# Patient Record
Sex: Male | Born: 1970 | Race: White | Hispanic: No | Marital: Single | Smoking: Former smoker
Health system: Southern US, Community
[De-identification: ages and names within clinical notes are randomized; demographics above are authoritative.]

## PROBLEM LIST (undated history)

## (undated) DIAGNOSIS — F419 Anxiety disorder, unspecified: Secondary | ICD-10-CM

## (undated) DIAGNOSIS — J449 Chronic obstructive pulmonary disease, unspecified: Secondary | ICD-10-CM

## (undated) DIAGNOSIS — I10 Essential (primary) hypertension: Secondary | ICD-10-CM

## (undated) HISTORY — DX: Anxiety disorder, unspecified: F41.9

## (undated) HISTORY — DX: Essential (primary) hypertension: I10

## (undated) HISTORY — DX: Chronic obstructive pulmonary disease, unspecified: J44.9

---

## 2005-03-10 ENCOUNTER — Emergency Department (HOSPITAL_COMMUNITY): Admission: EM | Admit: 2005-03-10 | Discharge: 2005-03-10 | Payer: Self-pay | Admitting: Emergency Medicine

## 2005-05-01 ENCOUNTER — Ambulatory Visit (HOSPITAL_COMMUNITY): Admission: RE | Admit: 2005-05-01 | Discharge: 2005-05-01 | Payer: Self-pay | Admitting: Surgery

## 2010-11-01 ENCOUNTER — Emergency Department (HOSPITAL_COMMUNITY): Payer: Self-pay

## 2010-11-01 ENCOUNTER — Emergency Department (HOSPITAL_COMMUNITY)
Admission: EM | Admit: 2010-11-01 | Discharge: 2010-11-01 | Disposition: A | Discharge status: Home / Self Care | Payer: Self-pay | Attending: Emergency Medicine | Admitting: Emergency Medicine

## 2010-11-01 DIAGNOSIS — S2239XA Fracture of one rib, unspecified side, initial encounter for closed fracture: Secondary | ICD-10-CM | POA: Insufficient documentation

## 2012-08-24 IMAGING — CR DG RIBS W/ CHEST 3+V*L*
4 series · 4 of 4 positions shown · non-contrast
Comparison: Chest x-ray from 05/01/2005

CLINICAL DATA: Injury with pain in the mid to upper posterior ribs.

LEFT RIBS AND CHEST - 3+ VIEW

[view not recorded (1 of 4)]
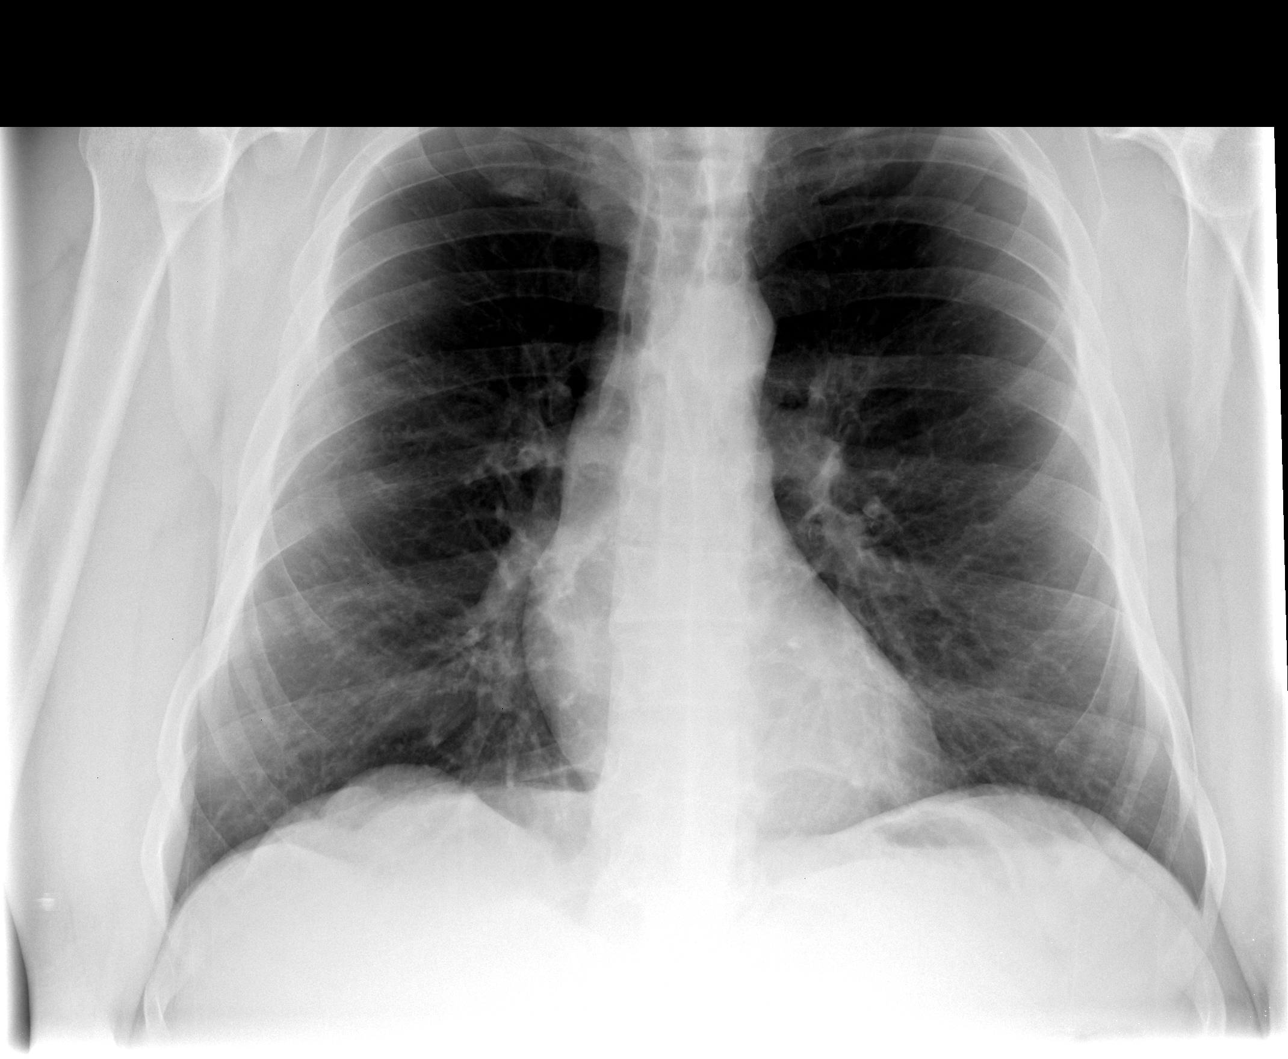

[view not recorded (2 of 4)]
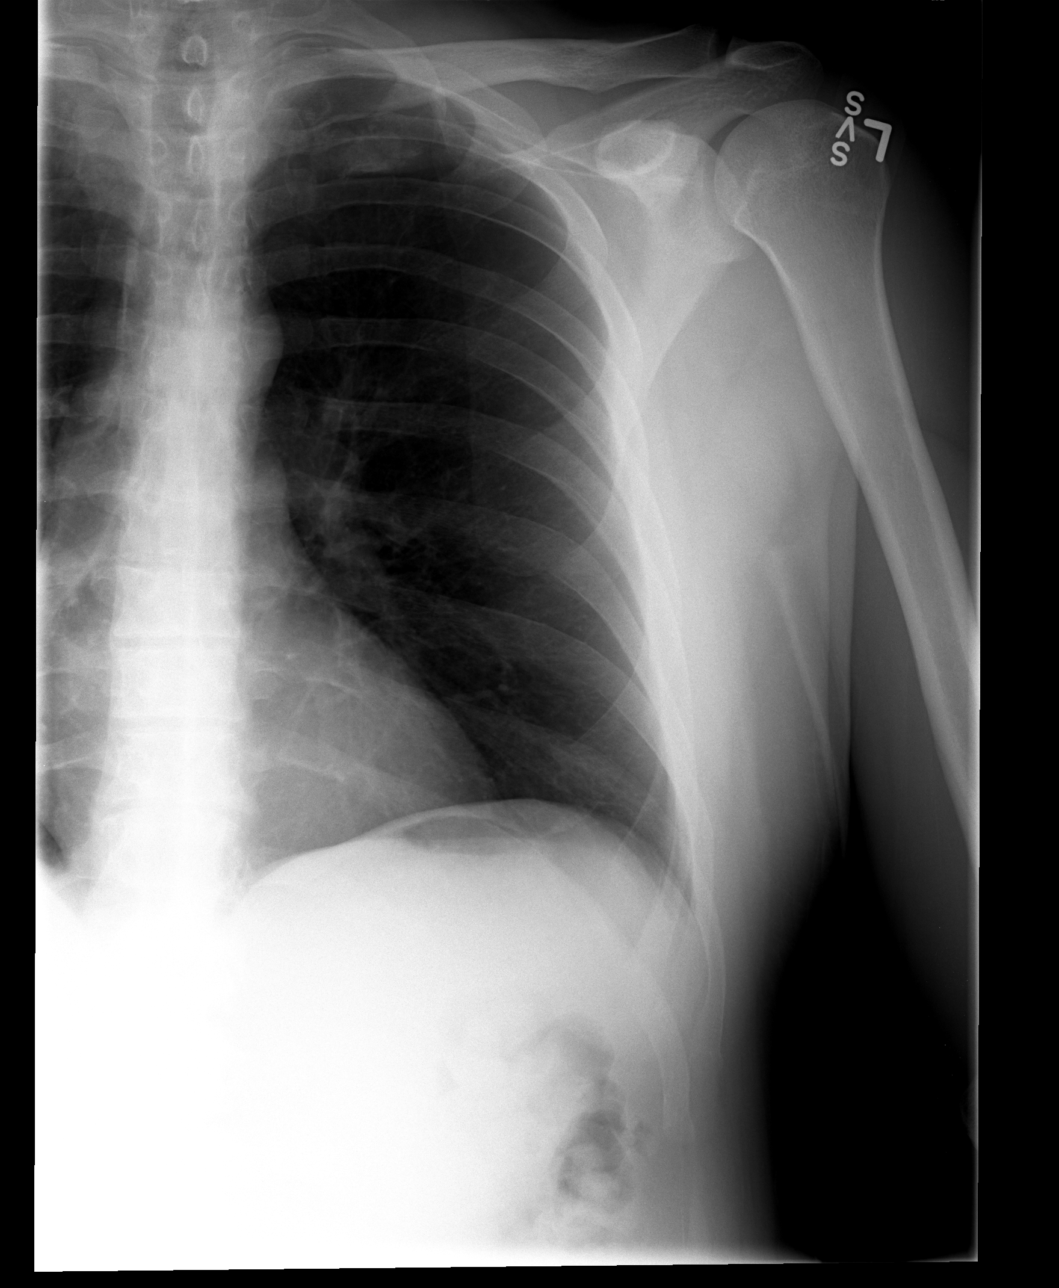

[view not recorded (3 of 4)]
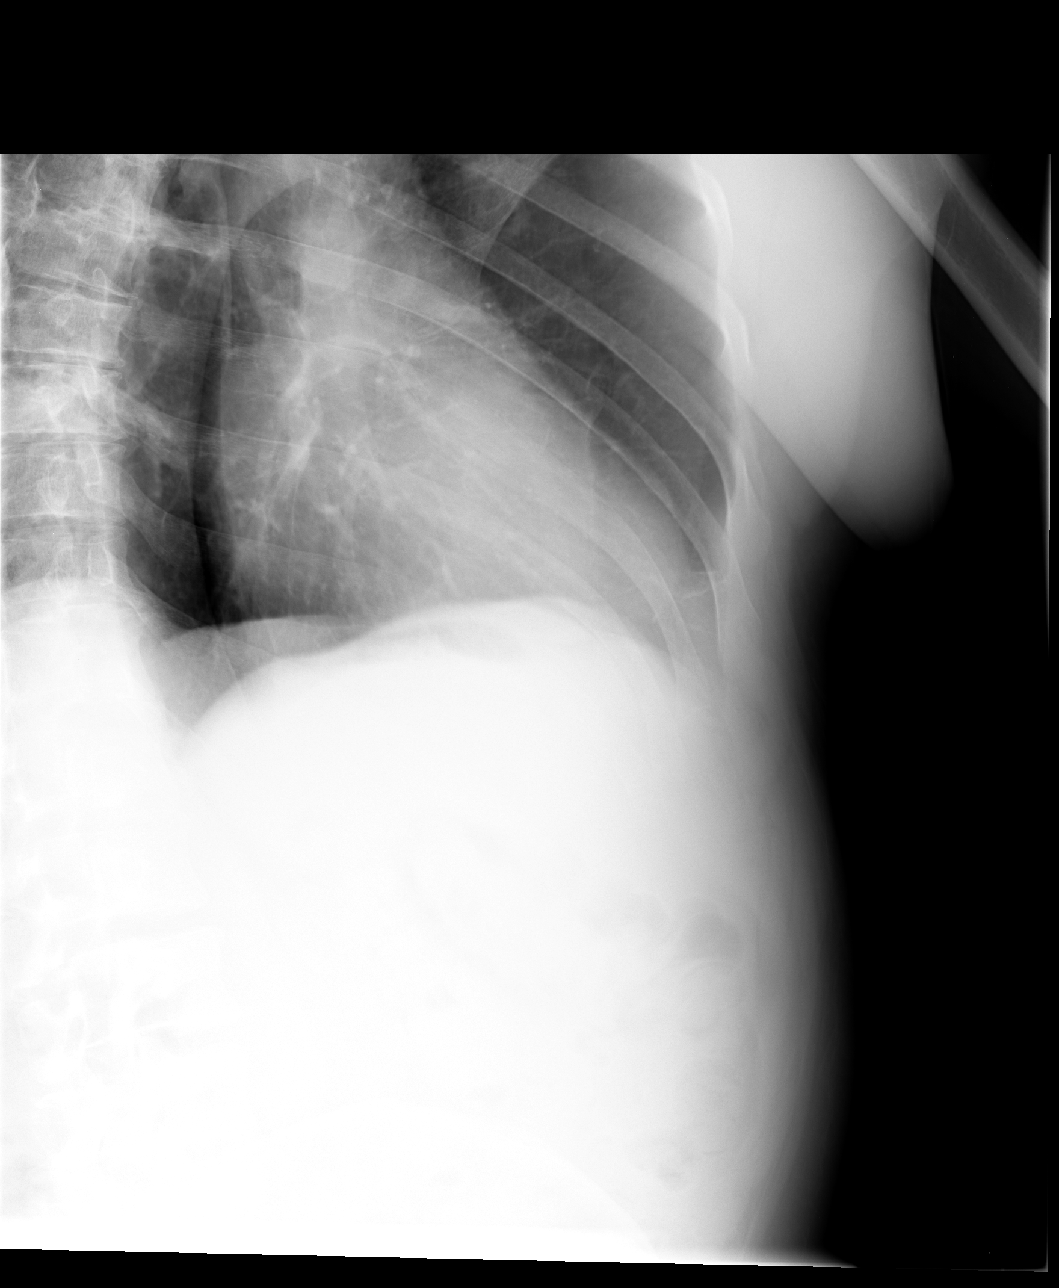

[view not recorded (4 of 4)]
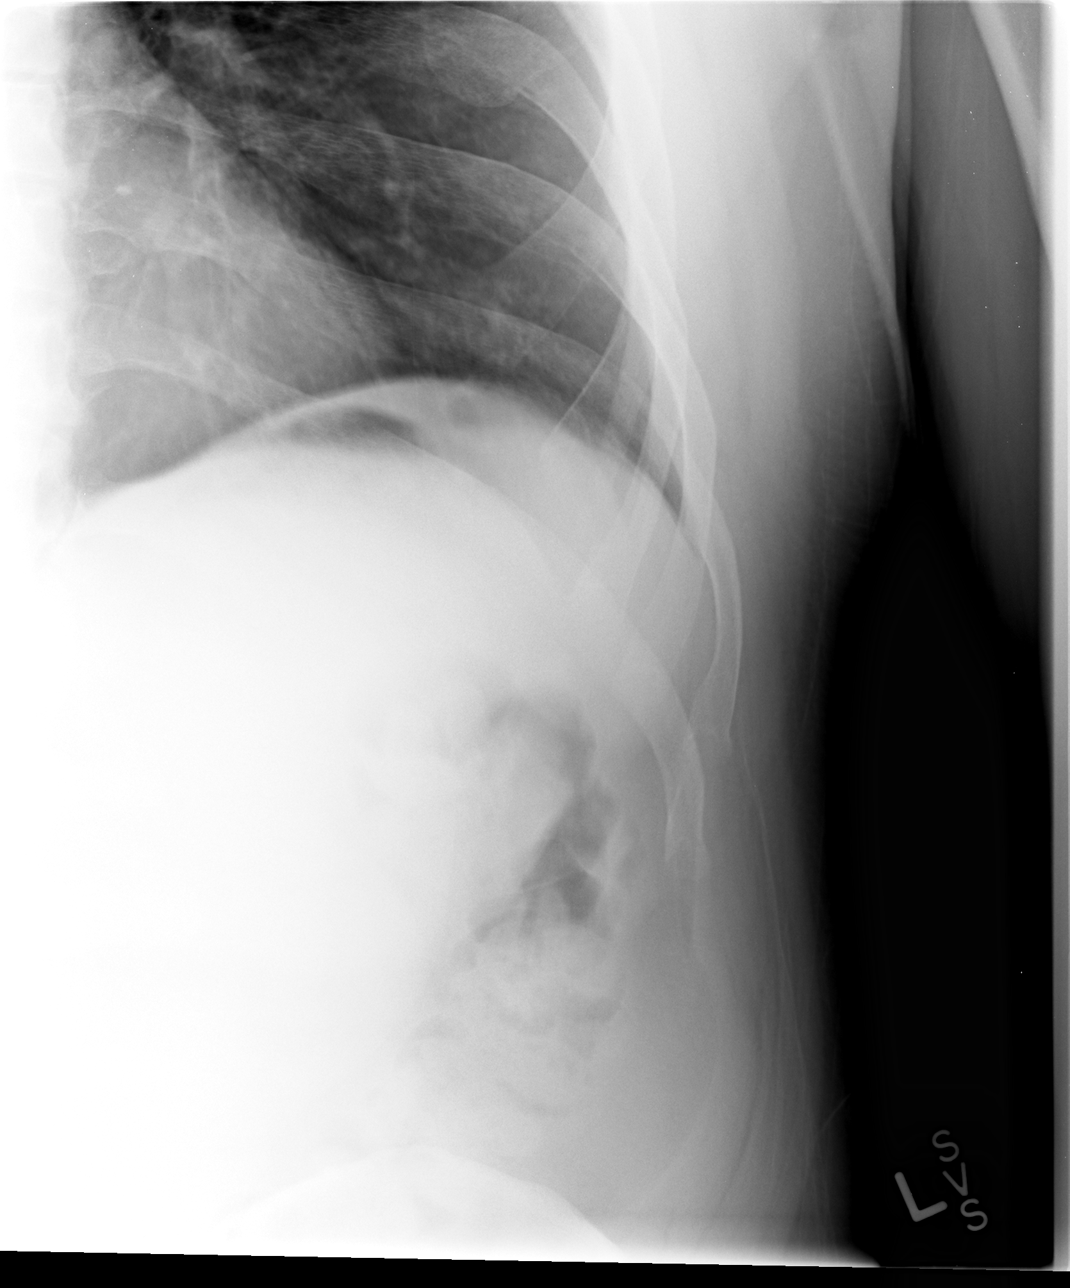

[4 of 4 positions shown; findings below may reference images not displayed]

FINDINGS: The lungs are clear without focal infiltrate, edema,
pneumothorax or pleural effusion. The cardiopericardial silhouette
is within normal limits for size.

Acute fracture of the posterior left fifth rib is evident and most
apparent on the chest film.
IMPRESSION: Posterior left fifth rib fracture.  No evidence for pneumothorax or
pleural effusion.

## 2021-04-12 DIAGNOSIS — R7989 Other specified abnormal findings of blood chemistry: Secondary | ICD-10-CM | POA: Insufficient documentation

## 2021-04-12 DIAGNOSIS — J9601 Acute respiratory failure with hypoxia: Secondary | ICD-10-CM | POA: Insufficient documentation

## 2021-11-28 DIAGNOSIS — F419 Anxiety disorder, unspecified: Secondary | ICD-10-CM | POA: Insufficient documentation

## 2021-12-29 ENCOUNTER — Encounter: Payer: Self-pay | Admitting: Nurse Practitioner

## 2021-12-29 ENCOUNTER — Ambulatory Visit (INDEPENDENT_AMBULATORY_CARE_PROVIDER_SITE_OTHER): Payer: 59 | Admitting: Nurse Practitioner

## 2021-12-29 ENCOUNTER — Telehealth: Payer: Self-pay

## 2021-12-29 VITALS — BP 161/93 | HR 76 | Temp 98.4°F | Ht 66.0 in | Wt 221.0 lb

## 2021-12-29 DIAGNOSIS — I10 Essential (primary) hypertension: Secondary | ICD-10-CM | POA: Diagnosis not present

## 2021-12-29 DIAGNOSIS — Z0001 Encounter for general adult medical examination with abnormal findings: Secondary | ICD-10-CM | POA: Diagnosis not present

## 2021-12-29 DIAGNOSIS — J449 Chronic obstructive pulmonary disease, unspecified: Secondary | ICD-10-CM | POA: Insufficient documentation

## 2021-12-29 DIAGNOSIS — I152 Hypertension secondary to endocrine disorders: Secondary | ICD-10-CM | POA: Insufficient documentation

## 2021-12-29 DIAGNOSIS — Z7689 Persons encountering health services in other specified circumstances: Secondary | ICD-10-CM

## 2021-12-29 MED ORDER — ALBUTEROL SULFATE HFA 108 (90 BASE) MCG/ACT IN AERS: INHALATION_SPRAY | RESPIRATORY_TRACT | 5 refills | Status: DC

## 2021-12-29 MED ORDER — LISINOPRIL 40 MG PO TABS: 40.0000 mg | ORAL_TABLET | Freq: Every day | ORAL | 3 refills | Status: DC

## 2021-12-29 MED ORDER — FUROSEMIDE 20 MG PO TABS: 20.0000 mg | ORAL_TABLET | Freq: Every day | ORAL | 0 refills | Status: DC

## 2021-12-29 MED ORDER — HYDRALAZINE HCL 25 MG PO TABS: 25.0000 mg | ORAL_TABLET | Freq: Three times a day (TID) | ORAL | 2 refills | Status: DC

## 2021-12-29 NOTE — Patient Instructions (Signed)
Hypertension, Adult ?Hypertension is another name for high blood pressure. High blood pressure forces your heart to work harder to pump blood. This can cause problems over time. ?There are two numbers in a blood pressure reading. There is a top number (systolic) over a bottom number (diastolic). It is best to have a blood pressure that is below 120/80. ?What are the causes? ?The cause of this condition is not known. Some other conditions can lead to high blood pressure. ?What increases the risk? ?Some lifestyle factors can make you more likely to develop high blood pressure: ?Smoking. ?Not getting enough exercise or physical activity. ?Being overweight. ?Having too much fat, sugar, calories, or salt (sodium) in your diet. ?Drinking too much alcohol. ?Other risk factors include: ?Having any of these conditions: ?Heart disease. ?Diabetes. ?High cholesterol. ?Kidney disease. ?Obstructive sleep apnea. ?Having a family history of high blood pressure and high cholesterol. ?Age. The risk increases with age. ?Stress. ?What are the signs or symptoms? ?High blood pressure may not cause symptoms. Very high blood pressure (hypertensive crisis) may cause: ?Headache. ?Fast or uneven heartbeats (palpitations). ?Shortness of breath. ?Nosebleed. ?Vomiting or feeling like you may vomit (nauseous). ?Changes in how you see. ?Very bad chest pain. ?Feeling dizzy. ?Seizures. ?How is this treated? ?This condition is treated by making healthy lifestyle changes, such as: ?Eating healthy foods. ?Exercising more. ?Drinking less alcohol. ?Your doctor may prescribe medicine if lifestyle changes do not help enough and if: ?Your top number is above 130. ?Your bottom number is above 80. ?Your personal target blood pressure may vary. ?Follow these instructions at home: ?Eating and drinking ? ?If told, follow the DASH eating plan. To follow this plan: ?Fill one half of your plate at each meal with fruits and vegetables. ?Fill one fourth of your plate  at each meal with whole grains. Whole grains include whole-wheat pasta, brown rice, and whole-grain bread. ?Eat or drink low-fat dairy products, such as skim milk or low-fat yogurt. ?Fill one fourth of your plate at each meal with low-fat (lean) proteins. Low-fat proteins include fish, chicken without skin, eggs, beans, and tofu. ?Avoid fatty meat, cured and processed meat, or chicken with skin. ?Avoid pre-made or processed food. ?Limit the amount of salt in your diet to less than 1,500 mg each day. ?Do not drink alcohol if: ?Your doctor tells you not to drink. ?You are pregnant, may be pregnant, or are planning to become pregnant. ?If you drink alcohol: ?Limit how much you have to: ?0-1 drink a day for women. ?0-2 drinks a day for men. ?Know how much alcohol is in your drink. In the [place] one drink equals one 12 oz bottle of beer (355 mL), one 5 oz glass of wine (148 mL), or one 1? oz glass of hard liquor (44 mL). ?Lifestyle ? ?Work with your doctor to stay at a healthy weight or to lose weight. Ask your doctor what the best weight is for you. ?Get at least 30 minutes of exercise that causes your heart to beat faster (aerobic exercise) most days of the week. This may include walking, swimming, or biking. ?Get at least 30 minutes of exercise that strengthens your muscles (resistance exercise) at least 3 days a week. This may include lifting weights or doing Pilates. ?Do not smoke or use any products that contain nicotine or tobacco. If you need help quitting, ask your doctor. ?Check your blood pressure at home as told by your doctor. ?Keep all follow-up visits. ?Medicines ?Take over-the-counter and prescription medicines   only as told by your doctor. Follow directions carefully. ?Do not skip doses of blood pressure medicine. The medicine does not work as well if you skip doses. Skipping doses also puts you at risk for problems. ?Ask your doctor about side effects or reactions to medicines that you should watch  for. ?Contact a doctor if: ?You think you are having a reaction to the medicine you are taking. ?You have headaches that keep coming back. ?You feel dizzy. ?You have swelling in your ankles. ?You have trouble with your vision. ?Get help right away if: ?You get a very bad headache. ?You start to feel mixed up (confused). ?You feel weak or numb. ?You feel faint. ?You have very bad pain in your: ?Chest. ?Belly (abdomen). ?You vomit more than once. ?You have trouble breathing. ?These symptoms may be an emergency. Get help right away. Call 911. ?Do not wait to see if the symptoms will go away. ?Do not drive yourself to the hospital. ?Summary ?Hypertension is another name for high blood pressure. ?High blood pressure forces your heart to work harder to pump blood. ?For most people, a normal blood pressure is less than 120/80. ?Making healthy choices can help lower blood pressure. If your blood pressure does not get lower with healthy choices, you may need to take medicine. ?This information is not intended to replace advice given to you by your health care provider. Make sure you discuss any questions you have with your health care provider. ?Document Revised: 06/05/2021 Document Reviewed: 06/05/2021 ?Elsevier Patient Education ? 2023 [employer] ? ?

## 2021-12-29 NOTE — Telephone Encounter (Signed)
Pt called back and stated he is taking his medication correctly , advised him we would be sending in a prescription. ? ?Also he does not recall the name of the hospital in [place] , pt did not sign a release today will get this at next apt  ? ?Thank you!   ?

## 2021-12-29 NOTE — Assessment & Plan Note (Addendum)
Patient is new to practice establishing care.  Hypertension is not  well controlled on current medication.  Started patient on 40 mg of lisinopril.  Daily blood pressure for 1 week follow-up in 2 weeks. ? ?Chronic disease management 3 months from today.  Provided extensive education to patient for the need to return for blood pressure management.  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 ? ?Good blood pressure cough recommended.  Patient currently uses a wrist blood pressure machine which is not so accurate.  Patient verbalized understanding and will get a new blood pressure patient ?

## 2021-12-29 NOTE — Telephone Encounter (Signed)
Thank you, He will need to keep a  blood pressure log daily for 7 days and follow up in clinic 2 weeks. He can also bring his blood pressure machine with him so we can check it. ? ? ?

## 2021-12-29 NOTE — Telephone Encounter (Signed)
Called pt about today's apt , no answer left VM for CB  ?

## 2021-12-29 NOTE — Progress Notes (Addendum)
? ? ? ? ? ?New Patient Note ? ?RE: Benjamin Casey MRN: [id number] DOB: 1971/01/09 ?Date of Office Visit: 12/29/2021 ? ?Chief Complaint: Annual Exam (CPE), Hypertension, and COPD ? ?History of Present Illness: ?Patient is a 51 year old male who presents to clinic to establish care with history of hypertension and COPD.  Assessment completed on patient.  With medication reconciliation and education completed.   ? ?.   ?Encounter for general adult medical examination  ?Physical: Patient's last physical exam was few year ago .  ?Weight: not appropriate for height (BMI greater 27%) ;  ?Blood Pressure: abnormal (BP greater than 120/80) ;  ?Medical History: Patient history reviewed ; Family history reviewed ;  ?Allergies Reviewed: No change in current allergies ;  ?Medications Reviewed: Medications reviewed - no changes ;  ?Lipids: Normal lipid levels ; labs completed results pending ?Smoking: Life-long non-smoker ; quit 11 months ago ?Physical Activity: Exercises at least 3 times per week ; No ?Alcohol/Drug Use: Is a non-drinker ; No illicit drug use ;  ?Patient is not afflicted from Stress Incontinence and Urge Incontinence  ?Safety: reviewed ; Patient wears a seat belt, has smoke detectors, has carbon monoxide detectors, wears sunscreen with extended sun exposure. ?Dental Care: biannual cleanings, brushes and flosses daily. ?Ophthalmology/Optometry: Annual visit.  ?Hearing loss: none ?Vision impairments: none  ? ?Pt presents for follow up of hypertension. Patient was diagnosed in 11 months ago.  The patient is tolerating the medication well without side effects. Compliance with treatment has been good; including taking medication as directed , maintains a healthy diet and regular exercise regimen , and following up as directed.  ? ? ?Patient presents with mild shortness of breath associated with COPD diagnosis in the last 11 months.  Patient has a 40 years smoking history and completed a low-grade CT of the lungs from a  [place] hospital.  Patient will provide/fax in hospital documentation.  There will be no repeat CT of lung ordered today until results from prior CT is reviewed. ? ? ?Assessment and Plan: ?Benjamin Casey is a 51 y.o. male with: ?Essential hypertension ?Patient is new to practice establishing care.  Hypertension is not  well controlled on current medication.  Started patient on 40 mg of lisinopril.  Daily blood pressure for 1 week follow-up in 2 weeks. ? ?Chronic disease management 3 months from today.  Provided extensive education to patient for the need to return for blood pressure management.  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 ? ?Good blood pressure cough recommended.  Patient currently uses a wrist blood pressure machine which is not so accurate.  Patient verbalized understanding and will get a new blood pressure patient ? ?Chronic obstructive pulmonary disease (HCC) ?Waiting on CT scan from a hospital in [place].  Albuterol refilled requested by patient.  Patient will need pulmonology referral in the future. ? ?Return in about 3 months (around 03/31/2022) for Chronic disease management. ? ? ?Diagnostics: ? ? ?Past Medi surgical ?Review of imaging ? ?Vitals bleeding ?Radiographic review ?Okay so ?Care.  She is here for cal History: ?Patient Active Problem List  ? Diagnosis Date Noted  ? Chronic obstructive pulmonary disease (HCC) 12/29/2021  ? Essential hypertension 12/29/2021  ? ?Past Medical History:  ?Diagnosis Date  ? Anxiety   ? COPD (chronic obstructive pulmonary disease) (HCC)   ? Hypertension   ? ?Past Surgical History: ?History reviewed. No pertinent surgical history. ?Medication List:  ?  Current Outpatient Medications  ?Medication Sig Dispense Refill  ? lisinopril (ZESTRIL) 40 MG tablet Take 1 tablet (40 mg total) by mouth daily. 90 tablet 3  ? albuterol (VENTOLIN HFA) 108 (90 Base) MCG/ACT inhaler 1 puff as needed 8 g 5  ? furosemide (LASIX) 20 MG tablet Take  1 tablet (20 mg total) by mouth daily. 90 tablet 0  ? hydrALAZINE (APRESOLINE) 25 MG tablet Take 1 tablet (25 mg total) by mouth 3 (three) times daily. 90 tablet 2  ? ?No current facility-administered medications for this visit.  ? ?Allergies: ?Allergies  ?Allergen Reactions  ? Seasonal Ic [Cholestatin]   ? ?Social History: ?Social History  ? ?Socioeconomic History  ? Marital status: Single  ?  Spouse name: Not on file  ? Number of children: Not on file  ? Years of education: Not on file  ? Highest education level: Not on file  ?Occupational History  ? Not on file  ?Tobacco Use  ? Smoking status: Former  ?  Types: Cigarettes  ?  Quit date: 01/2021  ?  Years since quitting: 0.9  ? Smokeless tobacco: Current  ?  Types: Chew  ?Vaping Use  ? Vaping Use: Never used  ?Substance and Sexual Activity  ? Alcohol use: Not Currently  ? Drug use: Not Currently  ?  Types: Marijuana  ? Sexual activity: Yes  ?Other Topics Concern  ? Not on file  ?Social History Narrative  ? Not on file  ? ?Social Determinants of Health  ? ?Financial Resource Strain: Not on file  ?Food Insecurity: Not on file  ?Transportation Needs: Not on file  ?Physical Activity: Not on file  ?Stress: Not on file  ?Social Connections: Not on file  ? ? ? ? ? ?Family History: ?Family History  ?Problem Relation Age of Onset  ? Diabetes Mother   ? Hypertension Mother   ? COPD Mother   ? COPD Father   ? Hypertension Brother   ? ?     ? ?Review of Systems  ?Constitutional: Negative.   ?HENT: Negative.    ?Eyes: Negative.   ?Respiratory:  Positive for shortness of breath. Negative for cough.   ?     History COPD  ?Cardiovascular:  Positive for leg swelling.  ?Gastrointestinal: Negative.   ?Genitourinary: Negative.   ?Musculoskeletal: Negative.   ?Skin: Negative.  Negative for rash.  ?Neurological: Negative.   ?All other systems reviewed and are negative. ? ? ?Objective: ?BP (!) 161/93 (BP Location: Left Arm, Patient Position: Sitting, Cuff Size: Large)   Pulse 76    Temp 98.4 ?F (36.9 ?C) (Temporal)   Ht 5\' 6"  (1.676 m)   Wt 221 lb (100.2 kg)   SpO2 97%   BMI 35.67 kg/m?  ?Body mass index is 35.67 kg/m?Marland Kitchen. ?Physical Exam ?Vitals and nursing note reviewed.  ?Constitutional:   ?   Appearance: Normal appearance.  ?HENT:  ?   Head: Normocephalic.  ?   Right Ear: External ear normal.  ?   Left Ear: External ear normal.  ?   Nose: Nose normal.  ?   Mouth/Throat:  ?   Mouth: Mucous membranes are moist.  ?   Pharynx: Oropharynx is clear.  ?Eyes:  ?   Conjunctiva/sclera: Conjunctivae normal.  ?Cardiovascular:  ?   Rate and Rhythm: Normal rate and regular rhythm.  ?   Pulses: Normal pulses.  ?   Heart sounds: Normal heart sounds.  ?Pulmonary:  ?   Effort: Pulmonary effort is normal.  ?  Breath sounds: Normal breath sounds.  ?Abdominal:  ?   General: Bowel sounds are normal.  ?Musculoskeletal:  ?   Left lower leg: Edema present.  ?Skin: ?   General: Skin is warm.  ?   Findings: No rash.  ?Neurological:  ?   General: No focal deficit present.  ?   Mental Status: He is alert and oriented to person, place, and time.  ?Psychiatric:     ?   Behavior: Behavior normal.  ? ?The plan was reviewed with the patient/family, and all questions/concerned were addressed. ? ?It was my pleasure to see Yonael today and participate in his care. Please feel free to contact me with any questions or concerns. ? ?Sincerely, ? ?Lynnell Chad NP ?[facility] ?

## 2021-12-29 NOTE — Assessment & Plan Note (Signed)
Waiting on CT scan from a hospital in [place].  Albuterol refilled requested by patient.  Patient will need pulmonology referral in the future. ?

## 2021-12-29 NOTE — Telephone Encounter (Signed)
LM to call and make 2 week appt - aware to call office to set up  ?

## 2021-12-30 LAB — CBC WITH DIFFERENTIAL/PLATELET
Basophils Absolute: 0.1 10*3/uL (ref 0.0–0.2)
Basos: 1 %
EOS (ABSOLUTE): 0.3 10*3/uL (ref 0.0–0.4)
Eos: 4 %
Hematocrit: 46.2 % (ref 37.5–51.0)
Hemoglobin: 15.8 g/dL (ref 13.0–17.7)
Immature Grans (Abs): 0.1 10*3/uL (ref 0.0–0.1)
Immature Granulocytes: 1 %
Lymphocytes Absolute: 2.1 10*3/uL (ref 0.7–3.1)
Lymphs: 30 %
MCH: 30.2 pg (ref 26.6–33.0)
MCHC: 34.2 g/dL (ref 31.5–35.7)
MCV: 88 fL (ref 79–97)
Monocytes Absolute: 0.7 10*3/uL (ref 0.1–0.9)
Monocytes: 10 %
Neutrophils Absolute: 3.8 10*3/uL (ref 1.4–7.0)
Neutrophils: 54 %
Platelets: 227 10*3/uL (ref 150–450)
RBC: 5.23 x10E6/uL (ref 4.14–5.80)
RDW: 13.5 % (ref 11.6–15.4)
WBC: 7.1 10*3/uL (ref 3.4–10.8)

## 2021-12-30 LAB — COMPREHENSIVE METABOLIC PANEL
ALT: 58 IU/L — ABNORMAL HIGH (ref 0–44)
AST: 35 IU/L (ref 0–40)
Albumin/Globulin Ratio: 2 (ref 1.2–2.2)
Albumin: 4.2 g/dL (ref 3.8–4.9)
Alkaline Phosphatase: 91 IU/L (ref 44–121)
BUN/Creatinine Ratio: 6 — ABNORMAL LOW (ref 9–20)
BUN: 7 mg/dL (ref 6–24)
Bilirubin Total: 0.4 mg/dL (ref 0.0–1.2)
CO2: 29 mmol/L (ref 20–29)
Calcium: 9.2 mg/dL (ref 8.7–10.2)
Chloride: 100 mmol/L (ref 96–106)
Creatinine, Ser: 1.2 mg/dL (ref 0.76–1.27)
Globulin, Total: 2.1 g/dL (ref 1.5–4.5)
Glucose: 93 mg/dL (ref 70–99)
Potassium: 4.5 mmol/L (ref 3.5–5.2)
Sodium: 143 mmol/L (ref 134–144)
Total Protein: 6.3 g/dL (ref 6.0–8.5)
eGFR: 73 mL/min/{1.73_m2} (ref >59)

## 2021-12-30 LAB — LIPID PANEL
Chol/HDL Ratio: 2.8 ratio (ref 0.0–5.0)
Cholesterol, Total: 140 mg/dL (ref 100–199)
HDL: 50 mg/dL (ref >39)
LDL Chol Calc (NIH): 73 mg/dL (ref 0–99)
Triglycerides: 92 mg/dL (ref 0–149)
VLDL Cholesterol Cal: 17 mg/dL (ref 5–40)

## 2022-03-05 ENCOUNTER — Encounter: Payer: Self-pay | Admitting: *Deleted

## 2022-04-01 ENCOUNTER — Encounter: Payer: Self-pay | Admitting: Nurse Practitioner

## 2022-04-01 ENCOUNTER — Ambulatory Visit: Payer: 59 | Admitting: Nurse Practitioner

## 2022-04-01 DIAGNOSIS — I10 Essential (primary) hypertension: Secondary | ICD-10-CM | POA: Diagnosis not present

## 2022-04-01 DIAGNOSIS — J449 Chronic obstructive pulmonary disease, unspecified: Secondary | ICD-10-CM | POA: Diagnosis not present

## 2022-04-01 MED ORDER — ALBUTEROL SULFATE HFA 108 (90 BASE) MCG/ACT IN AERS: INHALATION_SPRAY | RESPIRATORY_TRACT | 5 refills | Status: DC

## 2022-04-01 MED ORDER — FUROSEMIDE 20 MG PO TABS: 20.0000 mg | ORAL_TABLET | Freq: Every day | ORAL | 1 refills | Status: DC

## 2022-04-01 MED ORDER — HYDRALAZINE HCL 25 MG PO TABS: 25.0000 mg | ORAL_TABLET | Freq: Three times a day (TID) | ORAL | 2 refills | Status: DC

## 2022-04-01 MED ORDER — LISINOPRIL 40 MG PO TABS: 40.0000 mg | ORAL_TABLET | Freq: Every day | ORAL | 1 refills | Status: DC

## 2022-04-01 NOTE — Assessment & Plan Note (Signed)
No changes to current medication, hypertension is well controlled. Follow up in 6 months, medication refill sent to pharmacy

## 2022-04-01 NOTE — Patient Instructions (Signed)
Hypertension, Adult ?Hypertension is another name for high blood pressure. High blood pressure forces your heart to work harder to pump blood. This can cause problems over time. ?There are two numbers in a blood pressure reading. There is a top number (systolic) over a bottom number (diastolic). It is best to have a blood pressure that is below 120/80. ?What are the causes? ?The cause of this condition is not known. Some other conditions can lead to high blood pressure. ?What increases the risk? ?Some lifestyle factors can make you more likely to develop high blood pressure: ?Smoking. ?Not getting enough exercise or physical activity. ?Being overweight. ?Having too much fat, sugar, calories, or salt (sodium) in your diet. ?Drinking too much alcohol. ?Other risk factors include: ?Having any of these conditions: ?Heart disease. ?Diabetes. ?High cholesterol. ?Kidney disease. ?Obstructive sleep apnea. ?Having a family history of high blood pressure and high cholesterol. ?Age. The risk increases with age. ?Stress. ?What are the signs or symptoms? ?High blood pressure may not cause symptoms. Very high blood pressure (hypertensive crisis) may cause: ?Headache. ?Fast or uneven heartbeats (palpitations). ?Shortness of breath. ?Nosebleed. ?Vomiting or feeling like you may vomit (nauseous). ?Changes in how you see. ?Very bad chest pain. ?Feeling dizzy. ?Seizures. ?How is this treated? ?This condition is treated by making healthy lifestyle changes, such as: ?Eating healthy foods. ?Exercising more. ?Drinking less alcohol. ?Your doctor may prescribe medicine if lifestyle changes do not help enough and if: ?Your top number is above 130. ?Your bottom number is above 80. ?Your personal target blood pressure may vary. ?Follow these instructions at home: ?Eating and drinking ? ?If told, follow the DASH eating plan. To follow this plan: ?Fill one half of your plate at each meal with fruits and vegetables. ?Fill one fourth of your plate  at each meal with whole grains. Whole grains include whole-wheat pasta, brown rice, and whole-grain bread. ?Eat or drink low-fat dairy products, such as skim milk or low-fat yogurt. ?Fill one fourth of your plate at each meal with low-fat (lean) proteins. Low-fat proteins include fish, chicken without skin, eggs, beans, and tofu. ?Avoid fatty meat, cured and processed meat, or chicken with skin. ?Avoid pre-made or processed food. ?Limit the amount of salt in your diet to less than 1,500 mg each day. ?Do not drink alcohol if: ?Your doctor tells you not to drink. ?You are pregnant, may be pregnant, or are planning to become pregnant. ?If you drink alcohol: ?Limit how much you have to: ?0-1 drink a day for women. ?0-2 drinks a day for men. ?Know how much alcohol is in your drink. In the [place] one drink equals one 12 oz bottle of beer (355 mL), one 5 oz glass of wine (148 mL), or one 1? oz glass of hard liquor (44 mL). ?Lifestyle ? ?Work with your doctor to stay at a healthy weight or to lose weight. Ask your doctor what the best weight is for you. ?Get at least 30 minutes of exercise that causes your heart to beat faster (aerobic exercise) most days of the week. This may include walking, swimming, or biking. ?Get at least 30 minutes of exercise that strengthens your muscles (resistance exercise) at least 3 days a week. This may include lifting weights or doing Pilates. ?Do not smoke or use any products that contain nicotine or tobacco. If you need help quitting, ask your doctor. ?Check your blood pressure at home as told by your doctor. ?Keep all follow-up visits. ?Medicines ?Take over-the-counter and prescription medicines   only as told by your doctor. Follow directions carefully. ?Do not skip doses of blood pressure medicine. The medicine does not work as well if you skip doses. Skipping doses also puts you at risk for problems. ?Ask your doctor about side effects or reactions to medicines that you should watch  for. ?Contact a doctor if: ?You think you are having a reaction to the medicine you are taking. ?You have headaches that keep coming back. ?You feel dizzy. ?You have swelling in your ankles. ?You have trouble with your vision. ?Get help right away if: ?You get a very bad headache. ?You start to feel mixed up (confused). ?You feel weak or numb. ?You feel faint. ?You have very bad pain in your: ?Chest. ?Belly (abdomen). ?You vomit more than once. ?You have trouble breathing. ?These symptoms may be an emergency. Get help right away. Call 911. ?Do not wait to see if the symptoms will go away. ?Do not drive yourself to the hospital. ?Summary ?Hypertension is another name for high blood pressure. ?High blood pressure forces your heart to work harder to pump blood. ?For most people, a normal blood pressure is less than 120/80. ?Making healthy choices can help lower blood pressure. If your blood pressure does not get lower with healthy choices, you may need to take medicine. ?This information is not intended to replace advice given to you by your health care provider. Make sure you discuss any questions you have with your health care provider. ?Document Revised: 06/05/2021 Document Reviewed: 06/05/2021 ?Elsevier Patient Education ? 2023 [employer] ? ?

## 2022-04-01 NOTE — Progress Notes (Signed)
   Virtual Visit  Note Due to COVID-19 pandemic this visit was conducted virtually. This visit type was conducted due to national recommendations for restrictions regarding the COVID-19 Pandemic (e.g. social distancing, sheltering in place) in an effort to limit this patient's exposure and mitigate transmission in our community. All issues noted in this document were discussed and addressed.  A physical exam was not performed with this format.  I connected with Benjamin Casey on 04/01/22 at 2:40 pm  by telephone and verified that I am speaking with the correct person using two identifiers. Benjamin Casey is currently located at home during visit. The provider, Daryll Drown, NP is located in their office at time of visit.  I discussed the limitations, risks, security and privacy concerns of performing an evaluation and management service by telephone and the availability of in person appointments. I also discussed with the patient that there may be a patient responsible charge related to this service. The patient expressed understanding and agreed to proceed.   History and Present Illness:  HPI  Hypertension: Patient here for follow-up of elevated blood pressure. He is exercising and is adherent to low salt diet.  Blood pressure is well controlled at home. Cardiac symptoms none. Patient denies none.  Cardiovascular risk factors: hypertension and male gender. Use of agents associated with hypertension: none. History of target organ damage: none.   ROS   Observations/Objective: Tele-visit patient is not in distress  Assessment and Plan:  Essential hypertension No changes to current medication, hypertension is well controlled. Follow up in 6 months, medication refill sent to pharmacy   Follow Up Instructions: Follow up in 6 months.     I discussed the assessment and treatment plan with the patient. The patient was provided an opportunity to ask questions and all were answered. The patient  agreed with the plan and demonstrated an understanding of the instructions.   The patient was advised to call back or seek an in-person evaluation if the symptoms worsen or if the condition fails to improve as anticipated.  The above assessment and management plan was discussed with the patient. The patient verbalized understanding of and has agreed to the management plan. Patient is aware to call the clinic if symptoms persist or worsen. Patient is aware when to return to the clinic for a follow-up visit. Patient educated on when it is appropriate to go to the emergency department.   Time call ended:  2:55 pm   I provided 15 minutes of  non face-to-face time during this encounter.    Daryll Drown, NP

## 2022-04-03 ENCOUNTER — Ambulatory Visit: Payer: 59 | Admitting: Nurse Practitioner

## 2022-10-26 ENCOUNTER — Telehealth: Payer: Self-pay | Admitting: Nurse Practitioner

## 2022-10-26 NOTE — Telephone Encounter (Signed)
  Prescription Request  10/26/2022  Is this a "Controlled Substance" medicine? NO   Have you seen your PCP in the last 2 weeks? NO was a [facility] pt, has appt with Alvie Heidelberg on 12/03/2022 this is the soonest he can come in and will need refills on medications  If YES, route message to pool  -  If NO, patient needs to be scheduled for appointment.  What is the name of the medication or equipment? albuterol (VENTOLIN HFA) 108 (90 Base) MCG/ACT inhaler  furosemide (LASIX) 20 MG tablet  hydrALAZINE (APRESOLINE) 25 MG tablet  lisinopril (ZESTRIL) 40 MG tablet   Have you contacted your pharmacy to request a refill? no   Which pharmacy would you like this sent to? [facility]   Patient notified that their request is being sent to the clinical staff for review and that they should receive a response within 2 business days.

## 2022-10-27 ENCOUNTER — Other Ambulatory Visit: Payer: Self-pay

## 2022-10-27 DIAGNOSIS — J449 Chronic obstructive pulmonary disease, unspecified: Secondary | ICD-10-CM

## 2022-10-27 DIAGNOSIS — I10 Essential (primary) hypertension: Secondary | ICD-10-CM

## 2022-10-27 MED ORDER — LISINOPRIL 40 MG PO TABS: 40.0000 mg | ORAL_TABLET | Freq: Every day | ORAL | 2 refills | Status: DC

## 2022-10-27 MED ORDER — ALBUTEROL SULFATE HFA 108 (90 BASE) MCG/ACT IN AERS: INHALATION_SPRAY | RESPIRATORY_TRACT | 5 refills | Status: DC

## 2022-10-27 MED ORDER — FUROSEMIDE 20 MG PO TABS: 20.0000 mg | ORAL_TABLET | Freq: Every day | ORAL | 2 refills | Status: DC

## 2022-10-27 MED ORDER — HYDRALAZINE HCL 25 MG PO TABS: 25.0000 mg | ORAL_TABLET | Freq: Three times a day (TID) | ORAL | 2 refills | Status: DC

## 2022-12-03 ENCOUNTER — Encounter: Payer: Self-pay | Admitting: Family Medicine

## 2022-12-03 ENCOUNTER — Ambulatory Visit: Payer: BC Managed Care – PPO | Admitting: Family Medicine

## 2022-12-03 VITALS — BP 118/72 | HR 87 | Temp 98.3°F | Ht 66.0 in | Wt 220.0 lb

## 2022-12-03 DIAGNOSIS — I10 Essential (primary) hypertension: Secondary | ICD-10-CM

## 2022-12-03 DIAGNOSIS — R600 Localized edema: Secondary | ICD-10-CM

## 2022-12-03 DIAGNOSIS — Z720 Tobacco use: Secondary | ICD-10-CM

## 2022-12-03 DIAGNOSIS — T733XXA Exhaustion due to excessive exertion, initial encounter: Secondary | ICD-10-CM

## 2022-12-03 DIAGNOSIS — J449 Chronic obstructive pulmonary disease, unspecified: Secondary | ICD-10-CM

## 2022-12-03 DIAGNOSIS — Z1331 Encounter for screening for depression: Secondary | ICD-10-CM

## 2022-12-03 DIAGNOSIS — Z136 Encounter for screening for cardiovascular disorders: Secondary | ICD-10-CM

## 2022-12-03 NOTE — Addendum Note (Signed)
Addended by: Donzetta Kohut on: 12/03/2022 08:57 PM   Modules accepted: Level of Service

## 2022-12-03 NOTE — Progress Notes (Signed)
Acute Office Visit  Subjective:  Patient ID: Benjamin Casey, male    DOB: [birth year], 52 y.o.   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 States that he feels his stamina has dropped since turning 49.  Endorses that part of it is mental and part is metabolism  States that a friend of his is taking testosterone and is helping him and is interested in it.  Endorses poor sleep quality off and on. States that he sleeps basically every other night   Hypertension This is a chronic problem. The current episode started more than 1 year ago. The problem has been rapidly improving since onset. The problem is controlled. Associated symptoms include peripheral edema. Pertinent negatives include no blurred vision, chest pain, headaches, malaise/fatigue, neck pain, orthopnea, palpitations, PND, shortness of breath or sweats. There are no associated agents to hypertension. Risk factors for coronary artery disease include dyslipidemia, male gender, obesity, stress, smoking/tobacco exposure and sedentary lifestyle. Past treatments include ACE inhibitors and diuretics. The current treatment provides significant improvement. Compliance problems include medication cost, exercise, diet and psychosocial issues.    COPD  States that he struggles with breathing when he is traveling cross[place]country to areas with "thinner" air  Typically has to use his inhaler after he has exerted himself loading trailer onto truck.   Tobacco Use  Smoked tobacco and marijuana for years. Quit after he was hospitalized  Continues to use zyn. Uses marijuana very rarely, states that he is randomly drug tested at work.  Has gained 23lbs since quitting smoking.  Endorses heartburn. States that he has changed his diet to  reduce his symptoms.   Depression  Endorses low motivation and fatigue    Edema States that he has had lower left leg edema for >1 year  No pain, pallor, paresthesia Endorses varicose veins   Review of Systems  Constitutional:  Negative for malaise/fatigue.  Eyes:  Negative for blurred vision.  Respiratory:  Negative for shortness of breath.   Cardiovascular:  Negative for chest pain, palpitations, orthopnea and PND.  Musculoskeletal:  Negative for neck pain.  Neurological:  Negative for headaches.  As Per HPI   Objective:  BP 118/72   Pulse 87   Temp 98.3 F (36.8 C)   Ht 5\' 6"  (1.676 m)   Wt 220 lb (99.8 kg)   SpO2 94%   BMI 35.51 kg/m   Physical Exam Constitutional:      General: He is not in acute distress.    Appearance: Normal appearance. He is not ill-appearing, toxic-appearing or diaphoretic.  Cardiovascular:     Rate and Rhythm: Normal rate and regular rhythm.     Pulses: Normal pulses.          Posterior tibial pulses are 2+ on the right side and 2+ on the left side.     Heart sounds: Normal heart sounds. No murmur heard.    No gallop.     Comments: Varicose veins present along lower left extremity  Pulmonary:     Effort: Pulmonary effort is normal. No respiratory distress.     Breath sounds: Decreased air movement present. Decreased breath sounds present. No wheezing, rhonchi or rales.  Musculoskeletal:     Left lower  leg: Swelling present. No deformity, lacerations, tenderness or bony tenderness. 2+ Edema present.  Skin:    General: Skin is warm.     Capillary Refill: Capillary refill takes less than 2 seconds.  Neurological:     General: No focal deficit present.     Mental Status: He is alert and oriented to person, place, and time. Mental status is at baseline.     Motor: No weakness.  Psychiatric:        Mood and Affect: Mood normal.        Behavior: Behavior normal.        Thought Content: Thought content normal.        Judgment: Judgment  normal.    Assessment & Plan:  Benjamin Casey was seen today for establish care. Diagnoses and all orders for this visit:  Primary hypertension -     CMP14+EGFR; Future Well controlled. Continue current regimen. Labs as above.   Edema of left lower leg -     Ambulatory referral to Vascular Surgery; Future Referral placed as above for edema. Education provided to patient for red flag symptoms for patient to present to ED for potential DVT.   Chronic obstructive pulmonary disease, unspecified COPD type Provided patient with sample of Breztri  Patient to take one inhalation twice daily Patient is not established with Pulm  Confirmed "severe emphysema with chronic appearing interstitial changes" on CT from OSH 04/12/2021. Of note, CT also identified shotty lymph nodes in the mediastinum in the right lung hilum   Fatigue due to excessive exertion, initial encounter Labs as below. Will communicate results to patient once available.  Discussed with patient that testosterone is a controlled substance that if he would like to begin work up for testosterone supplementation will need to complete CSA and toxassure. Will assess other causes of fatigue: anemia, thyroid dysfunction. Patient declined referral for sleep study at this time.  -     Anemia Profile B; Future -     Thyroid Panel With TSH; Future -     Testosterone,Free and Total; Future -     VITAMIN D 25 Hydroxy (Vit-D Deficiency, Fractures); Future -     ToxASSURE Select 13 (MW), Urine  Encounter for screening for depression Pt screened positive for depression today with PHQ-9. Pt offered nonpharmacologic and pharmacologic therapy. Pt declined initiating treatment at this time. Safety contract established today with patient in clinic. Denies intent to harm herself or others. Pt to notify provider if they would like to initiate treatment.   Encounter for screening for cardiovascular disorders -     Lipid panel; Future Not fasting. Instructed  patient to return to clinic when he is fasting.   Tobacco use Continues to use tobacco. Not ready to quit at this time. Will assess readiness to quit   The above assessment and management plan was discussed with the patient. The patient verbalized understanding of and has agreed to the management plan using shared-decision making. Patient is aware to call the clinic if they develop any new symptoms or if symptoms fail to improve or worsen. Patient is aware when to return to the clinic for a follow-up visit. Patient educated on when it is appropriate to go to the emergency department.   Follow up in 1 month for CPE and chronic conditions   Donzetta Kohut, DNP-FNP [facility] [address] [place], [place] [address] [phone]

## 2022-12-25 ENCOUNTER — Other Ambulatory Visit: Payer: Self-pay | Admitting: *Deleted

## 2022-12-25 DIAGNOSIS — M7989 Other specified soft tissue disorders: Secondary | ICD-10-CM

## 2023-01-01 ENCOUNTER — Encounter: Payer: Self-pay | Admitting: Vascular Surgery

## 2023-01-01 ENCOUNTER — Encounter: Payer: Self-pay | Admitting: Family Medicine

## 2023-01-01 ENCOUNTER — Encounter: Payer: BC Managed Care – PPO | Admitting: Family Medicine

## 2023-01-01 ENCOUNTER — Ambulatory Visit (HOSPITAL_COMMUNITY): Admission: RE | Admit: 2023-01-01 | Payer: Self-pay | Source: Ambulatory Visit

## 2023-06-02 ENCOUNTER — Other Ambulatory Visit: Payer: Self-pay | Admitting: Family Medicine

## 2023-06-02 DIAGNOSIS — J449 Chronic obstructive pulmonary disease, unspecified: Secondary | ICD-10-CM

## 2023-07-04 ENCOUNTER — Other Ambulatory Visit: Payer: Self-pay | Admitting: Family Medicine

## 2023-07-04 DIAGNOSIS — I10 Essential (primary) hypertension: Secondary | ICD-10-CM

## 2023-07-04 DIAGNOSIS — J449 Chronic obstructive pulmonary disease, unspecified: Secondary | ICD-10-CM

## 2023-07-05 MED ORDER — LISINOPRIL 40 MG PO TABS
40.0000 mg | ORAL_TABLET | Freq: Every day | ORAL | 1 refills | Status: DC
Start: 2023-07-05 — End: 2023-08-13

## 2023-07-05 MED ORDER — HYDRALAZINE HCL 25 MG PO TABS
25.0000 mg | ORAL_TABLET | Freq: Three times a day (TID) | ORAL | 1 refills | Status: DC
Start: 2023-07-05 — End: 2023-08-13

## 2023-07-05 MED ORDER — FUROSEMIDE 20 MG PO TABS
20.0000 mg | ORAL_TABLET | Freq: Every day | ORAL | 1 refills | Status: DC
Start: 2023-07-05 — End: 2023-08-13

## 2023-07-05 NOTE — Addendum Note (Signed)
Addended by: Julious Payer D on: 07/05/2023 03:31 PM   Modules accepted: Orders

## 2023-07-05 NOTE — Telephone Encounter (Signed)
Made appt for Dec 13. 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 He said he was also out of all of his meds. He needs lasix, apresoline and zestril

## 2023-07-05 NOTE — Telephone Encounter (Signed)
Gabrielle pt NTBS 30-d given 06/03/23

## 2023-08-06 ENCOUNTER — Other Ambulatory Visit: Payer: Self-pay | Admitting: Family Medicine

## 2023-08-06 DIAGNOSIS — J449 Chronic obstructive pulmonary disease, unspecified: Secondary | ICD-10-CM

## 2023-08-13 ENCOUNTER — Encounter: Payer: Self-pay | Admitting: Family Medicine

## 2023-08-13 ENCOUNTER — Ambulatory Visit: Payer: BC Managed Care – PPO | Admitting: Family Medicine

## 2023-08-13 VITALS — BP 117/76 | HR 87 | Temp 98.0°F | Ht 66.0 in | Wt 235.0 lb

## 2023-08-13 DIAGNOSIS — Z136 Encounter for screening for cardiovascular disorders: Secondary | ICD-10-CM | POA: Diagnosis not present

## 2023-08-13 DIAGNOSIS — J441 Chronic obstructive pulmonary disease with (acute) exacerbation: Secondary | ICD-10-CM | POA: Diagnosis not present

## 2023-08-13 DIAGNOSIS — R5383 Other fatigue: Secondary | ICD-10-CM | POA: Diagnosis not present

## 2023-08-13 DIAGNOSIS — I10 Essential (primary) hypertension: Secondary | ICD-10-CM

## 2023-08-13 DIAGNOSIS — T733XXA Exhaustion due to excessive exertion, initial encounter: Secondary | ICD-10-CM | POA: Diagnosis not present

## 2023-08-13 DIAGNOSIS — T733XXD Exhaustion due to excessive exertion, subsequent encounter: Secondary | ICD-10-CM

## 2023-08-13 DIAGNOSIS — R600 Localized edema: Secondary | ICD-10-CM | POA: Diagnosis not present

## 2023-08-13 DIAGNOSIS — J449 Chronic obstructive pulmonary disease, unspecified: Secondary | ICD-10-CM

## 2023-08-13 LAB — LIPID PANEL

## 2023-08-13 MED ORDER — PREDNISONE 20 MG PO TABS
40.0000 mg | ORAL_TABLET | Freq: Every day | ORAL | 0 refills | Status: AC
Start: 2023-08-13 — End: 2023-08-18

## 2023-08-13 MED ORDER — LISINOPRIL 40 MG PO TABS
40.0000 mg | ORAL_TABLET | Freq: Every day | ORAL | 1 refills | Status: DC
Start: 2023-08-13 — End: 2023-10-21

## 2023-08-13 MED ORDER — ALBUTEROL SULFATE HFA 108 (90 BASE) MCG/ACT IN AERS: INHALATION_SPRAY | RESPIRATORY_TRACT | 0 refills | Status: DC

## 2023-08-13 MED ORDER — HYDRALAZINE HCL 25 MG PO TABS: 25.0000 mg | ORAL_TABLET | Freq: Three times a day (TID) | ORAL | 1 refills | Status: DC

## 2023-08-13 MED ORDER — FUROSEMIDE 20 MG PO TABS: 20.0000 mg | ORAL_TABLET | Freq: Every day | ORAL | 1 refills | Status: DC

## 2023-08-13 MED ORDER — BREZTRI AEROSPHERE 160-9-4.8 MCG/ACT IN AERO: 2.0000 | INHALATION_SPRAY | Freq: Two times a day (BID) | RESPIRATORY_TRACT | 11 refills | Status: DC

## 2023-08-13 NOTE — Progress Notes (Addendum)
Subjective:  Patient ID: Benjamin Casey, male    DOB: Sep 10, 1970, 52 y.o.   MRN: [id number]  Patient Care Team: Arrie Senate, FNP as PCP - General (Family Medicine)   Chief Complaint:  Medical Management of Chronic Issues  HPI: Benjamin Casey is a 52 y.o. male presenting on 08/13/2023 for Medical Management of Chronic Issues  1. Primary hypertension (Primary) Has BP monitor at home Yes BP at home average states that it is normal. States that he gets 120/80s. Highest is 130/ ROS Denies anxiety, fatigue, peripheral edema, changes to vision, chest pain, headaches, palpitations, sweats, PND, orthopnea Meds lisinopril  CAD risks hypertension  2. Chronic obstructive pulmonary disease, unspecified COPD type (HCC) Reports that lately he has had more difficulty breathing.  States that he was diagnosed with COPD in 2022 from a hospitalization in [place]. States that he is having more trouble breathing especially [place]. States that sometimes he is using albuterol daily to 2-3 times per day especially in [place] and sometimes he is using it 0 times per day when he is home in [facility]  Anxiety  States that he has always been anxious. States that he just "deals with it". He does not wish to take medications for it or to seek counseling. Denies SI.   Former Smoker  Quit 2022 Started at 52 years old  2 PPD while smoking States that he completed lung cancer screening while admitted to hospital in [place]. He does not know the name of the hospital.   Edema of Left Lower Leg Takes lasix daily and states this is improving his swelling. Reports that it started a year ago. States that foot is sore on the ball of his foot, but otherwise not painful. Denies fever, rash.  States that if it does not take it, his ankles will swell. He does not wear compression stockings. He does not ambulate often as he is always driving.    Fatigue  Continues  with fatigue. States that he is not completely exhausted but is still has lack of motivation and interest in a lot of things.   Relevant past medical, surgical, family, and social history reviewed and updated as indicated.  Allergies and medications reviewed and updated. Data reviewed: Chart in Epic.  Past Medical History:  Diagnosis Date   Anxiety    COPD (chronic obstructive pulmonary disease) (HCC)    Hypertension    History reviewed. No pertinent surgical history.  Social History   Socioeconomic History   Marital status: Single    Spouse name: Not on file   Number of children: Not on file   Years of education: Not on file   Highest education level: Not on file  Occupational History   Not on file  Tobacco Use   Smoking status: Former    Current packs/day: 0.00    Types: Cigarettes    Quit date: 01/2021    Years since quitting: 2.5   Smokeless tobacco: Current    Types: Chew  Vaping Use   Vaping status: Never Used  Substance and Sexual Activity   Alcohol use: Not Currently   Drug use: Not Currently    Types: Marijuana   Sexual activity: Yes  Other Topics Concern   Not on file  Social History Narrative   Not on file   Social Drivers of Health   Financial Resource Strain: Not on file  Food Insecurity: Not on  file  Transportation Needs: Not on file  Physical Activity: Not on file  Stress: Not on file  Social Connections: Not on file  Intimate Partner Violence: Not on file    Outpatient Encounter Medications as of 08/13/2023  Medication Sig   albuterol (VENTOLIN HFA) 108 (90 Base) MCG/ACT inhaler INHALE 1 PUFF BY MOUTH AS NEEDED. APPOINTMENT REQUIRED FOR FUTURE REFILLS   furosemide (LASIX) 20 MG tablet Take 1 tablet (20 mg total) by mouth daily.   hydrALAZINE (APRESOLINE) 25 MG tablet Take 1 tablet (25 mg total) by mouth 3 (three) times daily.   lisinopril (ZESTRIL) 40 MG tablet Take 1 tablet (40 mg total) by mouth daily.   No facility-administered encounter  medications on file as of 08/13/2023.    Allergies  Allergen Reactions   Seasonal Ic [Octacosanol]    Review of Systems As per HPI  Objective:  BP 117/76   Pulse 87   Temp 98 F (36.7 C)   Ht 5\' 6"  (1.676 m)   Wt 235 lb (106.6 kg)   SpO2 92%   BMI 37.93 kg/m    Wt Readings from Last 3 Encounters:  08/13/23 235 lb (106.6 kg)  12/03/22 220 lb (99.8 kg)  12/29/21 221 lb (100.2 kg)   Physical Exam Constitutional:      General: He is awake. He is not in acute distress.    Appearance: Normal appearance. He is well-developed and well-groomed. He is obese. He is not ill-appearing, toxic-appearing or diaphoretic.  Cardiovascular:     Rate and Rhythm: Normal rate and regular rhythm.     Pulses: Normal pulses.          Radial pulses are 2+ on the right side and 2+ on the left side.       Posterior tibial pulses are 2+ on the right side and 2+ on the left side.     Heart sounds: Normal heart sounds. No murmur heard.    No gallop.  Pulmonary:     Effort: Pulmonary effort is normal. No respiratory distress.     Breath sounds: Transmitted upper airway sounds present. No stridor. No wheezing, rhonchi or rales.     Comments: Hyperresonance  Increased work of breathing  Pursed lip breathing  Musculoskeletal:     Cervical back: Full passive range of motion without pain and neck supple.     Right lower leg: No edema.     Left lower leg: 2+ Edema present.  Skin:    General: Skin is warm.     Capillary Refill: Capillary refill takes less than 2 seconds.  Neurological:     General: No focal deficit present.     Mental Status: He is alert, oriented to person, place, and time and easily aroused. Mental status is at baseline.     GCS: GCS eye subscore is 4. GCS verbal subscore is 5. GCS motor subscore is 6.     Motor: No weakness.  Psychiatric:        Attention and Perception: Attention and perception normal.        Mood and Affect: Mood and affect normal.        Speech: Speech normal.         Behavior: Behavior normal. Behavior is cooperative.        Thought Content: Thought content normal. Thought content does not include homicidal or suicidal ideation. Thought content does not include homicidal or suicidal plan.        Cognition and Memory: Cognition  and memory normal.        Judgment: Judgment normal.    Results for orders placed or performed in visit on 12/29/21  CBC with Differential   Collection Time: 12/29/21  8:42 AM  Result Value Ref Range   WBC 7.1 3.4 - 10.8 x10E3/uL   RBC 5.23 4.14 - 5.80 x10E6/uL   Hemoglobin 15.8 13.0 - 17.7 g/dL   Hematocrit 40.9 81.1 - 51.0 %   MCV 88 79 - 97 fL   MCH 30.2 26.6 - 33.0 pg   MCHC 34.2 31.5 - 35.7 g/dL   RDW 91.4 78.2 - 95.6 %   Platelets 227 150 - 450 x10E3/uL   Neutrophils 54 Not Estab. %   Lymphs 30 Not Estab. %   Monocytes 10 Not Estab. %   Eos 4 Not Estab. %   Basos 1 Not Estab. %   Neutrophils Absolute 3.8 1.4 - 7.0 x10E3/uL   Lymphocytes Absolute 2.1 0.7 - 3.1 x10E3/uL   Monocytes Absolute 0.7 0.1 - 0.9 x10E3/uL   EOS (ABSOLUTE) 0.3 0.0 - 0.4 x10E3/uL   Basophils Absolute 0.1 0.0 - 0.2 x10E3/uL   Immature Granulocytes 1 Not Estab. %   Immature Grans (Abs) 0.1 0.0 - 0.1 x10E3/uL  Comprehensive metabolic panel   Collection Time: 12/29/21  8:42 AM  Result Value Ref Range   Glucose 93 70 - 99 mg/dL   BUN 7 6 - 24 mg/dL   Creatinine, Ser 2.13 0.76 - 1.27 mg/dL   eGFR 73 >08 MV/HQI/6.96   BUN/Creatinine Ratio 6 (L) 9 - 20   Sodium 143 134 - 144 mmol/L   Potassium 4.5 3.5 - 5.2 mmol/L   Chloride 100 96 - 106 mmol/L   CO2 29 20 - 29 mmol/L   Calcium 9.2 8.7 - 10.2 mg/dL   Total Protein 6.3 6.0 - 8.5 g/dL   Albumin 4.2 3.8 - 4.9 g/dL   Globulin, Total 2.1 1.5 - 4.5 g/dL   Albumin/Globulin Ratio 2.0 1.2 - 2.2   Bilirubin Total 0.4 0.0 - 1.2 mg/dL   Alkaline Phosphatase 91 44 - 121 IU/L   AST 35 0 - 40 IU/L   ALT 58 (H) 0 - 44 IU/L  Lipid Panel   Collection Time: 12/29/21  8:42 AM  Result Value Ref  Range   Cholesterol, Total 140 100 - 199 mg/dL   Triglycerides 92 0 - 149 mg/dL   HDL 50 >29 mg/dL   VLDL Cholesterol Cal 17 5 - 40 mg/dL   LDL Chol Calc (NIH) 73 0 - 99 mg/dL   Chol/HDL Ratio 2.8 0.0 - 5.0 ratio       08/13/2023   11:02 AM 12/03/2022    4:03 PM  Depression screen PHQ 2/9  Decreased Interest 0 2  Down, Depressed, Hopeless 0 1  PHQ - 2 Score 0 3  Altered sleeping 0 2  Tired, decreased energy 0 3  Change in appetite 2 0  Feeling bad or failure about yourself  0 0  Trouble concentrating 0 0  Moving slowly or fidgety/restless 0 0  Suicidal thoughts 0 0  PHQ-9 Score 2 8  Difficult doing work/chores Not difficult at all Not difficult at all       08/13/2023   11:02 AM 12/03/2022    4:04 PM  GAD 7 : Generalized Anxiety Score  Nervous, Anxious, on Edge 0 1  Control/stop worrying 2 0  Worry too much - different things 2 0  Trouble relaxing 0 0  Restless 0 1  Easily annoyed or irritable 0 0  Afraid - awful might happen 0 0  Total GAD 7 Score 4 2  Anxiety Difficulty Not difficult at all    Pertinent labs & imaging results that were available during my care of the patient were reviewed by me and considered in my medical decision making.  Assessment & Plan:  Stacie was seen today for medical management of chronic issues.  Diagnoses and all orders for this visit:  Primary hypertension Well controlled. Continue current regimen. Labs as below. Will communicate results to patient once available. Will await results to determine next steps.  -     lisinopril (ZESTRIL) 40 MG tablet; Take 1 tablet (40 mg total) by mouth daily. -     CMP14+EGFR  Chronic obstructive pulmonary disease, unspecified COPD type (HCC) Will start medication as below.  Provided samples.  Refills provided on rescue inhaler. Counseled on overuse.  -     Budeson-Glycopyrrol-Formoterol (BREZTRI AEROSPHERE) 160-9-4.8 MCG/ACT AERO; Inhale 2 puffs into the lungs 2 (two) times daily. -      hydrALAZINE (APRESOLINE) 25 MG tablet; Take 1 tablet (25 mg total) by mouth 3 (three) times daily. -     albuterol (VENTOLIN HFA) 108 (90 Base) MCG/ACT inhaler; INHALE 1 PUFF BY MOUTH AS NEEDED . APPOINTMENT REQUIRED FOR FUTURE REFILLS  COPD exacerbation (HCC) Medication as below.  Discussed with patient monitoring O2 with home O2 monitor  -     predniSONE (DELTASONE) 20 MG tablet; Take 2 tablets (40 mg total) by mouth daily with breakfast for 5 days.  Edema of left lower leg Refill provided. Encouraged patient to wear compression stockings and ambulate as frequently as possible. Discussed red flag symptoms.  -     furosemide (LASIX) 20 MG tablet; Take 1 tablet (20 mg total) by mouth daily.  Fatigue due to excessive exertion, subsequent encounter Labs as below. Will communicate results to patient once available. Will await results to determine next steps.  Patient aware testosterone should be drawn in mornings rather than evenings. -     Anemia Profile B -     Thyroid Panel With TSH -     Testosterone,Free and Total -     VITAMIN D 25 Hydroxy (Vit-D Deficiency, Fractures)  Encounter for screening for cardiovascular disorders Labs as below. Will communicate results to patient once available. Will await results to determine next steps.  Not fasting -     Lipid panel  Continue all other maintenance medications.  Follow up plan: Return in about 4 weeks (around 09/10/2023) for COPD exac folllow up . Will plan to [place] Left lower extremity, possible BNP at follow up  Continue healthy lifestyle choices, including diet (rich in fruits, vegetables, and lean proteins, and low in salt and simple carbohydrates) and exercise (at least 30 minutes of moderate physical activity daily).  Written and verbal instructions provided   The above assessment and management plan was discussed with the patient. The patient verbalized understanding of and has agreed to the management plan. Patient is aware to  call the clinic if they develop any new symptoms or if symptoms persist or worsen. Patient is aware when to return to the clinic for a follow-up visit. Patient educated on when it is appropriate to go to the emergency department.   Neale Burly, DNP-FNP [facility] [address] [place], [place] [address] [phone]

## 2023-08-14 LAB — CMP14+EGFR
ALT: 29 IU/L (ref 0–44)
AST: 23 IU/L (ref 0–40)
Albumin: 4.3 g/dL (ref 3.8–4.9)
Alkaline Phosphatase: 98 IU/L (ref 44–121)
BUN/Creatinine Ratio: 11 (ref 9–20)
BUN: 13 mg/dL (ref 6–24)
Bilirubin Total: 0.6 mg/dL (ref 0.0–1.2)
CO2: 33 mmol/L — ABNORMAL HIGH (ref 20–29)
Calcium: 9.5 mg/dL (ref 8.7–10.2)
Chloride: 96 mmol/L (ref 96–106)
Creatinine, Ser: 1.22 mg/dL (ref 0.76–1.27)
Globulin, Total: 2.7 g/dL (ref 1.5–4.5)
Glucose: 104 mg/dL — ABNORMAL HIGH (ref 70–99)
Potassium: 4.6 mmol/L (ref 3.5–5.2)
Sodium: 141 mmol/L (ref 134–144)
Total Protein: 7 g/dL (ref 6.0–8.5)
eGFR: 71 mL/min/{1.73_m2} (ref >59)

## 2023-08-14 LAB — ANEMIA PROFILE B
Basophils Absolute: 0.1 10*3/uL (ref 0.0–0.2)
Basos: 1 %
EOS (ABSOLUTE): 0.2 10*3/uL (ref 0.0–0.4)
Eos: 3 %
Ferritin: 34 ng/mL (ref 30–400)
Folate: 2.8 ng/mL — ABNORMAL LOW (ref >3.0)
Hematocrit: 50.8 % (ref 37.5–51.0)
Hemoglobin: 16.6 g/dL (ref 13.0–17.7)
Immature Grans (Abs): 0.1 10*3/uL (ref 0.0–0.1)
Immature Granulocytes: 1 %
Iron Saturation: 22 % (ref 15–55)
Iron: 105 ug/dL (ref 38–169)
Lymphocytes Absolute: 1.7 10*3/uL (ref 0.7–3.1)
Lymphs: 21 %
MCH: 29.4 pg (ref 26.6–33.0)
MCHC: 32.7 g/dL (ref 31.5–35.7)
MCV: 90 fL (ref 79–97)
Monocytes Absolute: 0.9 10*3/uL (ref 0.1–0.9)
Monocytes: 11 %
Neutrophils Absolute: 5.2 10*3/uL (ref 1.4–7.0)
Neutrophils: 63 %
Platelets: 238 10*3/uL (ref 150–450)
RBC: 5.65 x10E6/uL (ref 4.14–5.80)
RDW: 13.1 % (ref 11.6–15.4)
Retic Ct Pct: 1.2 % (ref 0.6–2.6)
Total Iron Binding Capacity: 474 ug/dL — ABNORMAL HIGH (ref 250–450)
UIBC: 369 ug/dL — ABNORMAL HIGH (ref 111–343)
Vitamin B-12: 374 pg/mL (ref 232–1245)
WBC: 8.2 10*3/uL (ref 3.4–10.8)

## 2023-08-14 LAB — LIPID PANEL
Cholesterol, Total: 145 mg/dL (ref 100–199)
HDL: 45 mg/dL (ref >39)
LDL CALC COMMENT:: 3.2 ratio (ref 0.0–5.0)
LDL Chol Calc (NIH): 86 mg/dL (ref 0–99)
Triglycerides: 72 mg/dL (ref 0–149)
VLDL Cholesterol Cal: 14 mg/dL (ref 5–40)

## 2023-08-14 LAB — THYROID PANEL WITH TSH
Free Thyroxine Index: 1.8 (ref 1.2–4.9)
T3 Uptake Ratio: 24 % (ref 24–39)
T4, Total: 7.5 ug/dL (ref 4.5–12.0)
TSH: 4.59 u[IU]/mL — ABNORMAL HIGH (ref 0.450–4.500)

## 2023-08-14 LAB — TESTOSTERONE,FREE AND TOTAL
Testosterone, Free: 7.1 pg/mL — ABNORMAL LOW (ref 7.2–24.0)
Testosterone: 196 ng/dL — ABNORMAL LOW (ref 264–916)

## 2023-08-14 LAB — VITAMIN D 25 HYDROXY (VIT D DEFICIENCY, FRACTURES): Vit D, 25-Hydroxy: 9.4 ng/mL — ABNORMAL LOW (ref 30.0–100.0)

## 2023-08-20 MED ORDER — VITAMIN D (ERGOCALCIFEROL) 1.25 MG (50000 UNIT) PO CAPS: 50000.0000 [IU] | ORAL_CAPSULE | ORAL | 0 refills | Status: AC

## 2023-08-20 NOTE — Addendum Note (Signed)
Addended by: Neale Burly on: 08/20/2023 09:44 AM   Modules accepted: Orders

## 2023-08-20 NOTE — Progress Notes (Signed)
Can we add on A1C? If not, we can collect at next appt.  Slightly elevated CO2 likely due to smoking history. Folate deficiency, recommend patient start OTC supplementation. Not anemic. Mildly increased TSH, however otherwise normal panel, will repeat at future appt. Low testosterone. Lab collected at improper time. If patient would like to follow up with urology, I can place referral. Vitamin D level is low. I have sent in a weekly supplement to take for the next 12 weeks. After that, take a daily OTC vitamin D supplement with 1000-2000 IU. Cholesterol looks great! The 10-year ASCVD risk score (Arnett DK, et al., 2019) is: 3.4%

## 2023-09-24 ENCOUNTER — Ambulatory Visit (HOSPITAL_BASED_OUTPATIENT_CLINIC_OR_DEPARTMENT_OTHER): Payer: Self-pay | Attending: Family Medicine

## 2023-09-24 ENCOUNTER — Encounter: Payer: Self-pay | Admitting: Family Medicine

## 2023-09-24 ENCOUNTER — Ambulatory Visit: Payer: Commercial Managed Care - PPO | Admitting: Family Medicine

## 2023-09-24 VITALS — BP 135/79 | HR 100 | Temp 97.8°F | Wt 235.0 lb

## 2023-09-24 DIAGNOSIS — R000 Tachycardia, unspecified: Secondary | ICD-10-CM

## 2023-09-24 DIAGNOSIS — R600 Localized edema: Secondary | ICD-10-CM

## 2023-09-24 DIAGNOSIS — R0902 Hypoxemia: Secondary | ICD-10-CM

## 2023-09-24 DIAGNOSIS — J449 Chronic obstructive pulmonary disease, unspecified: Secondary | ICD-10-CM

## 2023-09-24 DIAGNOSIS — E1169 Type 2 diabetes mellitus with other specified complication: Secondary | ICD-10-CM | POA: Insufficient documentation

## 2023-09-24 DIAGNOSIS — Z720 Tobacco use: Secondary | ICD-10-CM | POA: Insufficient documentation

## 2023-09-24 DIAGNOSIS — Z9189 Other specified personal risk factors, not elsewhere classified: Secondary | ICD-10-CM

## 2023-09-24 DIAGNOSIS — R7309 Other abnormal glucose: Secondary | ICD-10-CM

## 2023-09-24 DIAGNOSIS — E1159 Type 2 diabetes mellitus with other circulatory complications: Secondary | ICD-10-CM

## 2023-09-24 DIAGNOSIS — I152 Hypertension secondary to endocrine disorders: Secondary | ICD-10-CM

## 2023-09-24 DIAGNOSIS — R0602 Shortness of breath: Secondary | ICD-10-CM

## 2023-09-24 LAB — BAYER DCA HB A1C WAIVED: HB A1C (BAYER DCA - WAIVED): 6.5 % — ABNORMAL HIGH (ref 4.8–5.6)

## 2023-09-24 MED ORDER — ALBUTEROL SULFATE (2.5 MG/3ML) 0.083% IN NEBU: 2.5000 mg | INHALATION_SOLUTION | Freq: Four times a day (QID) | RESPIRATORY_TRACT | 12 refills | Status: DC | PRN

## 2023-09-24 MED ORDER — IPRATROPIUM-ALBUTEROL 0.5-2.5 (3) MG/3ML IN SOLN
3.0000 mL | Freq: Once | RESPIRATORY_TRACT | Status: AC
  Administered 2023-09-24: 3 mL via RESPIRATORY_TRACT

## 2023-09-24 NOTE — Progress Notes (Signed)
Subjective:  Patient ID: Benjamin Casey, male    DOB: August 26, 1971, 53 y.o.   MRN: [id number]  Patient Care Team: Arrie Senate, FNP as PCP - General (Family Medicine)   Chief Complaint:  COPD   HPI: Benjamin Casey is a 53 y.o. male presenting on 09/24/2023 for COPD   HPI 1. Elevated glucose level States that he is going to try to lose weight. States that he quit smoking and gained 20 lbs. Quit 2 years ago.  Started smoking at 53 years old  2 PPD     2. Chronic obstructive pulmonary disease, unspecified COPD type (HCC) (Primary) COPD, Follow up States that he is getting over a URI.  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  He was last seen for this 1 months ago. Changes made include started Breztri .   He reports good compliance with treatment. He is not having side effects.  he uses rescue inhaler 2 per  day . He IS experiencing cough and wheezing. He is NOT experiencing dyspnea. he reports breathing is Improved.  Pulmonary Functions Testing Results:  No results found for: "FEV1", "FVC", "FEV1FVC", "TLC"  -----------------------------------------------------------------------------------------  3. Primary hypertension Has BP monitor at home Yes BP at home average 128-135/80 ROS Denies anxiety, fatigue, peripheral edema, changes to vision, chest pain, headaches, palpitations, sweats, SOB, PND, orthopnea Meds lisinopril  CAD risks hypertension, COPD  4. Edema of left lower leg Continues to have edema, states that it has improved he has started wearing compression stockings.     Relevant past medical, surgical, family, and social history reviewed and updated as indicated.  Allergies and medications reviewed and updated. Data reviewed: Chart in Epic.   Past Medical History:  Diagnosis Date   Anxiety    COPD (chronic obstructive pulmonary disease) (HCC)    Hypertension     No past surgical history on file.  Social History    Socioeconomic History   Marital status: Single    Spouse name: Not on file   Number of children: Not on file   Years of education: Not on file   Highest education level: Not on file  Occupational History   Not on file  Tobacco Use   Smoking status: Former    Current packs/day: 0.00    Types: Cigarettes    Quit date: 01/2021    Years since quitting: 2.6   Smokeless tobacco: Current    Types: Chew  Vaping Use   Vaping status: Never Used  Substance and Sexual Activity   Alcohol use: Not Currently   Drug use: Not Currently    Types: Marijuana   Sexual activity: Yes  Other Topics Concern   Not on file  Social History Narrative   Not on file   Social Drivers of Health   Financial Resource Strain: Not on file  Food Insecurity: Not on file  Transportation Needs: Not on file  Physical Activity: Not on file  Stress: Not on file  Social Connections: Not on file  Intimate Partner Violence: Not on file    Outpatient Encounter Medications as of 09/24/2023  Medication Sig   albuterol (VENTOLIN HFA) 108 (90 Base) MCG/ACT inhaler INHALE 1 PUFF BY MOUTH AS NEEDED . APPOINTMENT REQUIRED FOR FUTURE REFILLS   Budeson-Glycopyrrol-Formoterol (BREZTRI AEROSPHERE) 160-9-4.8 MCG/ACT AERO Inhale 2 puffs into the lungs 2 (two) times daily.   furosemide (LASIX) 20 MG tablet Take 1 tablet (20 mg total) by mouth daily.  hydrALAZINE (APRESOLINE) 25 MG tablet Take 1 tablet (25 mg total) by mouth 3 (three) times daily.   lisinopril (ZESTRIL) 40 MG tablet Take 1 tablet (40 mg total) by mouth daily.   Vitamin D, Ergocalciferol, (DRISDOL) 1.25 MG (50000 UNIT) CAPS capsule Take 1 capsule (50,000 Units total) by mouth every 7 (seven) days for 12 doses.   No facility-administered encounter medications on file as of 09/24/2023.   Allergies  Allergen Reactions   Seasonal Ic [Octacosanol]     Review of Systems As per HPI  Objective:  BP 135/79   Pulse 100   Temp 97.8 F (36.6 C)   Wt 235 lb  (106.6 kg)   SpO2 94%   BMI 37.93 kg/m    Wt Readings from Last 3 Encounters:  09/24/23 235 lb (106.6 kg)  08/13/23 235 lb (106.6 kg)  12/03/22 220 lb (99.8 kg)    Physical Exam Constitutional:      General: He is awake. He is not in acute distress.    Appearance: Normal appearance. He is well-developed and well-groomed. He is obese. He is not ill-appearing, toxic-appearing or diaphoretic.  Cardiovascular:     Rate and Rhythm: Normal rate and regular rhythm.     Pulses: Normal pulses.          Radial pulses are 2+ on the right side and 2+ on the left side.       Posterior tibial pulses are 2+ on the right side and 2+ on the left side.     Heart sounds: Normal heart sounds. No murmur heard.    No gallop.  Pulmonary:     Effort: Pulmonary effort is normal. No respiratory distress.     Breath sounds: Decreased air movement present. No stridor. Examination of the right-upper field reveals decreased breath sounds. Examination of the left-upper field reveals decreased breath sounds. Examination of the right-middle field reveals decreased breath sounds. Examination of the left-middle field reveals decreased breath sounds. Examination of the right-lower field reveals decreased breath sounds. Examination of the left-lower field reveals decreased breath sounds. Decreased breath sounds present. No wheezing, rhonchi or rales.     Comments: Improved lung sounds after nebulizer  Musculoskeletal:     Cervical back: Full passive range of motion without pain and neck supple.     Right lower leg: No edema.     Left lower leg: 1+ Edema present.  Skin:    General: Skin is warm.     Capillary Refill: Capillary refill takes less than 2 seconds.  Neurological:     General: No focal deficit present.     Mental Status: He is alert, oriented to person, place, and time and easily aroused. Mental status is at baseline.     GCS: GCS eye subscore is 4. GCS verbal subscore is 5. GCS motor subscore is 6.      Motor: No weakness.  Psychiatric:        Attention and Perception: Attention and perception normal.        Mood and Affect: Mood and affect normal.        Speech: Speech normal.        Behavior: Behavior normal. Behavior is cooperative.        Thought Content: Thought content normal. Thought content does not include homicidal or suicidal ideation. Thought content does not include homicidal or suicidal plan.        Cognition and Memory: Cognition and memory normal.  Judgment: Judgment normal.     Results for orders placed or performed in visit on 08/13/23  CMP14+EGFR   Collection Time: 08/13/23 11:40 AM  Result Value Ref Range   Glucose 104 (H) 70 - 99 mg/dL   BUN 13 6 - 24 mg/dL   Creatinine, Ser 1.61 0.76 - 1.27 mg/dL   eGFR 71 >09 UE/AVW/0.98   BUN/Creatinine Ratio 11 9 - 20   Sodium 141 134 - 144 mmol/L   Potassium 4.6 3.5 - 5.2 mmol/L   Chloride 96 96 - 106 mmol/L   CO2 33 (H) 20 - 29 mmol/L   Calcium 9.5 8.7 - 10.2 mg/dL   Total Protein 7.0 6.0 - 8.5 g/dL   Albumin 4.3 3.8 - 4.9 g/dL   Globulin, Total 2.7 1.5 - 4.5 g/dL   Bilirubin Total 0.6 0.0 - 1.2 mg/dL   Alkaline Phosphatase 98 44 - 121 IU/L   AST 23 0 - 40 IU/L   ALT 29 0 - 44 IU/L  Anemia Profile B   Collection Time: 08/13/23 11:40 AM  Result Value Ref Range   Total Iron Binding Capacity 474 (H) 250 - 450 ug/dL   UIBC 119 (H) 147 - 829 ug/dL   Iron 562 38 - 130 ug/dL   Iron Saturation 22 15 - 55 %   Ferritin 34 30 - 400 ng/mL   Vitamin B-12 374 232 - 1,245 pg/mL   Folate 2.8 (L) >3.0 ng/mL   WBC 8.2 3.4 - 10.8 x10E3/uL   RBC 5.65 4.14 - 5.80 x10E6/uL   Hemoglobin 16.6 13.0 - 17.7 g/dL   Hematocrit 86.5 78.4 - 51.0 %   MCV 90 79 - 97 fL   MCH 29.4 26.6 - 33.0 pg   MCHC 32.7 31.5 - 35.7 g/dL   RDW 69.6 29.5 - 28.4 %   Platelets 238 150 - 450 x10E3/uL   Neutrophils 63 Not Estab. %   Lymphs 21 Not Estab. %   Monocytes 11 Not Estab. %   Eos 3 Not Estab. %   Basos 1 Not Estab. %   Neutrophils  Absolute 5.2 1.4 - 7.0 x10E3/uL   Lymphocytes Absolute 1.7 0.7 - 3.1 x10E3/uL   Monocytes Absolute 0.9 0.1 - 0.9 x10E3/uL   EOS (ABSOLUTE) 0.2 0.0 - 0.4 x10E3/uL   Basophils Absolute 0.1 0.0 - 0.2 x10E3/uL   Immature Granulocytes 1 Not Estab. %   Immature Grans (Abs) 0.1 0.0 - 0.1 x10E3/uL   Retic Ct Pct 1.2 0.6 - 2.6 %  Lipid panel   Collection Time: 08/13/23 11:40 AM  Result Value Ref Range   Cholesterol, Total 145 100 - 199 mg/dL   Triglycerides 72 0 - 149 mg/dL   HDL 45 >13 mg/dL   VLDL Cholesterol Cal 14 5 - 40 mg/dL   LDL Chol Calc (NIH) 86 0 - 99 mg/dL   Chol/HDL Ratio 3.2 0.0 - 5.0 ratio  Thyroid Panel With TSH   Collection Time: 08/13/23 11:40 AM  Result Value Ref Range   TSH 4.590 (H) 0.450 - 4.500 uIU/mL   T4, Total 7.5 4.5 - 12.0 ug/dL   T3 Uptake Ratio 24 24 - 39 %   Free Thyroxine Index 1.8 1.2 - 4.9  Testosterone,Free and Total   Collection Time: 08/13/23 11:40 AM  Result Value Ref Range   Testosterone 196 (L) 264 - 916 ng/dL   Testosterone, Free 7.1 (L) 7.2 - 24.0 pg/mL  VITAMIN D 25 Hydroxy (Vit-D Deficiency, Fractures)   Collection Time:  08/13/23 11:40 AM  Result Value Ref Range   Vit D, 25-Hydroxy 9.4 (L) 30.0 - 100.0 ng/mL       09/24/2023    9:34 AM 09/24/2023    9:33 AM 08/13/2023   11:02 AM 12/03/2022    4:03 PM  Depression screen PHQ 2/9  Decreased Interest  0 0 2  Down, Depressed, Hopeless 0 0 0 1  PHQ - 2 Score 0 0 0 3  Altered sleeping 0 0 0 2  Tired, decreased energy 0 2 0 3  Change in appetite  0 2 0  Feeling bad or failure about yourself   0 0 0  Trouble concentrating  0 0 0  Moving slowly or fidgety/restless  2 0 0  Suicidal thoughts  0 0 0  PHQ-9 Score 0 4 2 8   Difficult doing work/chores  Somewhat difficult Not difficult at all Not difficult at all       08/13/2023   11:02 AM 12/03/2022    4:04 PM  GAD 7 : Generalized Anxiety Score  Nervous, Anxious, on Edge 0 1  Control/stop worrying 2 0  Worry too much - different things 2 0   Trouble relaxing 0 0  Restless 0 1  Easily annoyed or irritable 0 0  Afraid - awful might happen 0 0  Total GAD 7 Score 4 2  Anxiety Difficulty Not difficult at all     Pertinent labs & imaging results that were available during my care of the patient were reviewed by me and considered in my medical decision making.  Assessment & Plan:  Deitrich was seen today for copd.  Diagnoses and all orders for this visit: 1. Chronic obstructive pulmonary disease, unspecified COPD type (HCC) (Primary) Patient had worsening oxygen saturation on exam today with tachycardia and increased work of breathing. 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 Imaging as below. Will communicate results to patient once available. Will await results to determine next steps. Will provide patient with nebulizer for management of symptoms. Discussed with patient to not use both albuterol inhaler and nebulizer at the same time. Discussed with pt his overuse of albuterol and encouraged him to decrease use. Patient to continue to use Breztri daily.   - CT Angio Chest Pulmonary Embolism (PE) W or WO Contrast; Future - ipratropium-albuterol (DUONEB) 0.5-2.5 (3) MG/3ML nebulizer solution 3 mL - Ambulatory referral to Pulmonology - For home use only DME Nebulizer machine - albuterol (PROVENTIL) (2.5 MG/3ML) 0.083% nebulizer solution; Take 3 mLs (2.5 mg total) by nebulization every 6 (six) hours as needed for wheezing or shortness of breath.  Dispense: 75 mL; Refill: 12  2. Hypertension associated with diabetes (HCC) Slightly above goal. Patient to continue current medications. Patient to work on [employer].   3. Type 2 diabetes mellitus with other specified complication, without long-term current use of insulin (HCC) Patient had elevated glucose at last labs. Collected A1C in office today and confirmed patient has diabetes. Patient notified in office. He does not wish to  start medication at this time, but wishes to try diet and lifestyle modifications. Will have patient follow up in one month once he is back from next long haul drive to discuss medication options, diet and lifestyle modifications, and blood sugar monitoring.   4. Elevated glucose level As above.  - Bayer DCA Hb A1c Waived  5. Edema of left lower leg As above, concern for PE given symptoms.  Imagining as below.  - CT Angio Chest Pulmonary Embolism (PE) W or WO Contrast; Future - US Venous Img Lower Unilateral Left; Future  6. Hypoxia Improved with nebulization. Provided nebulizer as above for patient. Encouraged patient to follow up with pulmonology. Concerned for PE, imaging ordered.  - CT Angio Chest Pulmonary Embolism (PE) W or WO Contrast; Future  7. Shortness of breath As above.  - CT Angio Chest Pulmonary Embolism (PE) W or WO Contrast; Future  8. Tachycardia As above.  - CT Angio Chest Pulmonary Embolism (PE) W or WO Contrast; Future  9. Sedentary lifestyle As above.  - CT Angio Chest Pulmonary Embolism (PE) W or WO Contrast; Future  10. Tobacco use Referral placed as below for patient to be evaluated by pulmonology and for lung cancer screening.  - Ambulatory referral to Pulmonology - Ambulatory Referral Lung Cancer Screening [facility]  Continue all other maintenance medications.  Follow up plan: Return in about 4 weeks (around 10/22/2023) for new diabetes visit .  Continue healthy lifestyle choices, including diet (rich in fruits, vegetables, and lean proteins, and low in salt and simple carbohydrates) and exercise (at least 30 minutes of moderate physical activity daily).  Written and verbal instructions provided   The above assessment and management plan was discussed with the patient. The patient verbalized understanding of and has agreed to the management plan. Patient is aware to call the clinic if they develop any new symptoms or if symptoms persist or  worsen. Patient is aware when to return to the clinic for a follow-up visit. Patient educated on when it is appropriate to go to the emergency department.   Neale Burly, DNP-FNP [facility] [address] [place], [place] [address] [phone]

## 2023-09-24 NOTE — Progress Notes (Signed)
Discussed with patient in visit. New Diabetes appt scheduled.

## 2023-10-21 ENCOUNTER — Other Ambulatory Visit: Payer: Self-pay | Admitting: Family Medicine

## 2023-10-21 DIAGNOSIS — I10 Essential (primary) hypertension: Secondary | ICD-10-CM

## 2023-10-21 DIAGNOSIS — R600 Localized edema: Secondary | ICD-10-CM

## 2023-10-27 ENCOUNTER — Other Ambulatory Visit: Payer: Self-pay | Admitting: Family Medicine

## 2023-10-27 DIAGNOSIS — J449 Chronic obstructive pulmonary disease, unspecified: Secondary | ICD-10-CM

## 2023-10-29 ENCOUNTER — Ambulatory Visit: Payer: Commercial Managed Care - PPO | Admitting: Family Medicine

## 2023-11-03 ENCOUNTER — Encounter: Payer: Self-pay | Admitting: Family Medicine

## 2023-11-22 ENCOUNTER — Other Ambulatory Visit: Payer: Self-pay | Admitting: Family Medicine

## 2023-11-22 DIAGNOSIS — I10 Essential (primary) hypertension: Secondary | ICD-10-CM

## 2023-11-22 DIAGNOSIS — R600 Localized edema: Secondary | ICD-10-CM

## 2023-11-26 ENCOUNTER — Encounter: Payer: Self-pay | Admitting: *Deleted

## 2023-12-01 ENCOUNTER — Telehealth: Payer: Self-pay | Admitting: Family Medicine

## 2024-02-18 ENCOUNTER — Telehealth: Payer: Self-pay | Admitting: Family Medicine

## 2024-08-07 DIAGNOSIS — Z87891 Personal history of nicotine dependence: Secondary | ICD-10-CM | POA: Insufficient documentation

## 2024-08-12 NOTE — Care Plan (Signed)
°  Problem: [place] GAS EXCHANGE-IMPAIRED Goal: Adequate oxygenation 08/12/2024 1132 by Ronnald Furlough, RN Outcome: Adequate for Discharge 08/12/2024 0953 by Ronnald Furlough, RN Outcome: Progressing Goal: Adequate ventilation 08/12/2024 1132 by Ronnald Furlough, RN Outcome: Adequate for Discharge 08/12/2024 0953 by Ronnald Furlough, RN Outcome: Progressing   Problem: Deep Venous Thrombosis, Risk of Goal: Absence of deep venous thrombosis 08/12/2024 1132 by Ronnald Furlough, RN Outcome: Adequate for Discharge 08/12/2024 0953 by Ronnald Furlough, RN Outcome: Progressing   Problem: Bleeding, Risk of Goal: Absence of active bleeding 08/12/2024 1132 by Ronnald Furlough, RN Outcome: Adequate for Discharge 08/12/2024 0953 by Ronnald Furlough, RN Outcome: Progressing Goal: Absence of impaired coagulation signs and symptoms 08/12/2024 1132 by Ronnald Furlough, RN Outcome: Adequate for Discharge 08/12/2024 0953 by Ronnald Furlough, RN Outcome: Progressing

## 2024-08-12 NOTE — Discharge Summary (Signed)
 [facility]  Discharge Summary  PCP: No primary care provider on file. Discharge Details   Admit date:         08/09/2024 Discharge date and time:       08/12/2024 Hospital LOS:    4  days   Active Hospital Problems   Diagnosis Date Noted POA   *COPD exacerbation (*) 08/09/2024 Yes   HTN (hypertension) 08/07/2024 Yes   Anxiety 11/28/2021 Yes   Chronic obstructive pulmonary disease (*) 04/13/2021 Yes    Resolved Hospital Problems  No resolved problems to display.      Current Discharge Medication List     START taking these medications      Details  budesonide -formoterol 80-4.5 mcg/actuation inhaler Commonly known as: SYMBICORT  Inhale two puffs into the lungs every 12 (twelve) hours. Quantity: 6.9 g   ipratropium-albuterol  0.5-2.5 mg/3 mL ML Soln nebulizer solution Commonly known as: DUONEB  Take 3 mLs by nebulization every 6 (six) hours as needed (wheezing or shortness of breath). Indication: Worsening of Chronic Obstructive Lung Disease Quantity: 360 mL   predniSONE  10 mg tablet Commonly known as: DELTASONE   Take 3 tabs daily x 3 days, then 2 tabs daily x 3 days, then 1 tab daily x 3 days, then stop Quantity: 18 tablet        Reason for medication changes:as below  Hospital Course   Indication for Admission/chief complaint: SOB  Hospital Course:       From H&P of Dr. Adina Agnew:Benjamin Casey is a 53 y.o. male with a history of COPD, HTN, former tobacco user who presents due to SOB.  Patient states states that for approximately 1 week he has just felt not right, he states the combination of feeling short of breath, generalized weakness/fatigue, and feeling like his blood pressure was too high.  Could not give me a number in regards to his blood pressure.  I reviewed the ED workup with him and informed him that did not see any signs of pneumonia or heart injury.  He states that he has been able  to quit smoking with no relapses for the last 2 years ever since he was told he had some problems.  As far as medications go he states he has been using his albuterol  inhaler intermittently and that he is post to be on 2 other blood pressure medicines but he cannot recall their names and that he ran out of them.  He states that he does not normally wear oxygen and that he has been coughing a lot more lately but it has not been productive.  I informed him he likely has a COPD exacerbation and we will try to bring him in and get his breathing improved with steroids and breathing treatments.  He notes that at home he has a small handheld nebulizer but it is broken and hopeful to try to get a new one.  Informed him we will see what we can do while inpatient.   Denies chest pain, nominal pain, nausea/vomiting, fever/chills, new skin rashes or lesions.  The rest of the discharge summary will be in problem based format:  Acute hypoxic respiratory failure Acute COPD exacerbation Pulmonary nodule -He was started on prednisone  and scheduled nebs. He was started on supplemental O2. He completed 3 days of abx for his COPD exacerbation. He was unable to be weaned to room air as he desaturated to 87% at rest. CM was consulted and he  was able to get home O2 under charity care as he has no insurance. He was also able to get a nebulizer for home use. He will complete a steroid taper at discharge. Prescription for PRN duonebs was also sent. He was started on symbicort also. Have put in a referral for Pulmonary f/u for PFTs. He also was found to have a 2mm pulmonary nodule that needs a f/u CT scan in 1 year to follow.   HTN -Continue Lisinopril  20mg  QD  T2DM -A1c at PCP's office 1/25 showed A1c of 6.5%; did not want to start medication at that time. Recommend continue f/u with PCP at discharge to follow.    Recommendations to physicians/followup needed: -PCP f/u in 3-5 days -OP Pulmonary f/u requested for  PFTs   Physical Exam: Vitals:   08/12/24 0806  BP:   Pulse: 82  Resp:   Temp:   SpO2: 94%   Physical Exam Vitals and nursing note reviewed.  Constitutional:      Appearance: Normal appearance.  HENT:     Head: Normocephalic and atraumatic.  Eyes:     Extraocular Movements: Extraocular movements intact.     Pupils: Pupils are equal, round, and reactive to light.  Cardiovascular:     Rate and Rhythm: Normal rate and regular rhythm.  Pulmonary:     Effort: Pulmonary effort is normal. No respiratory distress.     Breath sounds: No wheezing.     Comments: Diminished B/L in all lung fields Abdominal:     General: Bowel sounds are normal. There is no distension.     Tenderness: There is no abdominal tenderness.  Musculoskeletal:        General: No swelling. Normal range of motion.  Skin:    General: Skin is warm.     Coloration: Skin is not jaundiced.  Neurological:     General: No focal deficit present.     Mental Status: He is alert and oriented to person, place, and time.  Psychiatric:        Mood and Affect: Mood normal.        Behavior: Behavior normal.      Labs on Discharge:  Recent Labs    Units 08/12/24 0147 08/11/24 0321 08/10/24 0245 08/09/24 1119  WBC thou/mcL 10.6* 11.0* 11.7* 9.3  HGB gm/dL 84.6 84.5 82.9 82.1*  HCT % 52.0* 50.6 56.2* 56.5*  PLT thou/mcL 227 218 269 217   Recent Labs    Units 08/12/24 0148 08/11/24 0321 08/10/24 0245 08/09/24 1119  NA mmol/L 141 140 144 141  K mmol/L 3.9 4.1 4.3 4.0  CL mmol/L 97 98 98 96*  CO2 mmol/L 34* 32 33* 34*  BUN mg/dL 13 19 19 11   CREATININE mg/dL 9.17 9.09 8.85 9.15  CALCIUM mg/dL 9.0 8.6* 9.6 9.4   Recent Labs    Units 08/09/24 1119  BILITOT mg/dL 0.6  AST U/L 22  ALT U/L 27  ALKPHOS U/L 88  ALBUMIN gm/dL 3.8   No results for input(s): TSH, HGBA1C in the last 168 hours. Recent Labs    Units 08/09/24 1119  INR  1.0   No results for input(s): CHOL, LDL, HDL, TRIG in the  last 168 hours. No results for input(s): TROPONIN, CK in the last 168 hours.  Invalid input(s): CK-MB  Diagnostics: CT Angio Chest Pulmonary  Final Result  IMPRESSION:  1.  No acute cardiopulmonary abnormality.  2.  Indeterminate left upper lobe pulmonary nodule.    Excerpt from Guidelines  for Management of Incidental Pulmonary Nodules Detected on CT Images: From the Fleischner Society 2017  Radiology 2017.    Note: Guidelines do not apply to lung cancer screening, patients with immunosuppression, or patients with known primary cancer.    Nodule 5mm or less:  Low risk- No routine follow up.  High risk- Optional CT at 12 months.    (Nodules less than 6 mm do not require routine follow-up, but certain patients at high risk with suspicious nodule morphology, upper lobe location, or both may warrant 12 month follow up).    Electronically Signed by: Valma Companion, MD on 08/09/2024 1:13 PM    CT Head WO Contrast  Final Result  IMPRESSION:   No acute intracranial abnormality.       Electronically Signed by: Allison Kobus on 08/09/2024 1:15 PM    XR Chest Ap Portable  Final Result  IMPRESSION:  1.  No acute cardiopulmonary abnormality.    Electronically Signed by: Valma Companion, MD on 08/09/2024 11:47 AM       Post Hospital Care   Discharge Procedure Orders  Follow-up with Primary Care Physician  Standing Status: Future  Referral Priority: Urgent Referral Type: Consultation  Referral Reason: Evaluate and Return  Number of Visits Requested: 1 Expiration Date: 02/07/25   Ambulatory referral to Pulmonology  Standing Status: Future  Referral Priority: Routine Referral Type: Consultation  Referral Reason: Evaluate and Return  Requested Specialty: Pulmonary Diseases  Number of Visits Requested: 1 Expiration Date: 02/07/25   Consistent Carbohydrate Diet (diabetic)   Activity as tolerated   Notify physician - Temp  Order Comments: Call MD if  Temperature above 101  Degrees F   Notify Physician for increased shortness of breath   Notify Physician for increased or unrelieved pain   Notify Physician for change in color/consistency of sputum   Notify Physician for Weight gain  Order Comments: 2-3 lbs overnight, 5 lbs in 5 days   Notify Physician for confusion or disorientation     Potential for Rehab:        Good Code Status:   Full Code Disposition: Home Consults: None  Followup appointments: No future appointments.   Time spent in discharge process:  Documentation for time-based billing:  Total time spent of date of service was 40 minutes.  Patient care activities included preparing to see the patient such as reviewing the patient record, obtaining and/or reviewing separately obtained history, performing a medically appropriate history and physical examination, counseling and educating the patient, family, and/or caregiver, ordering prescription medications, tests, or procedures, referring and communicating with other health care providers when not separately reported during the visit, documenting clinical information in the electronic or other health record, independently interpreting results when not separately reported, communicating results to the patient/family/caregiver, and coordinating the care of the patient when not separately reported.    Electronically signed: Oliva DELENA Muck, DO 08/12/2024 / 9:33 AM  *The following note was dictated using voice recognition software.  Although the note was reviewed prior to submission, spelling errors and misused words could occur.  Please be aware that these errors may be present.  Please contact me if there is any concern that an error is present that would alter the meaning of the note*

## 2024-08-12 NOTE — Progress Notes (Signed)
 [facility] Case Management Discharge Note   Patient:   OAKLEE SUNGA MR Number:  [id number] Patient Date of Birth: 10-09-1970 Age/Sex:  53 y.o./male   Discharge Plan   Case Management interviewed: Patient Disposition: Home                                        DME Discharge Planning Discharge Equipment: Nebulizer, Oxygen DME Nebulizer Status: Delivered bedside DME Oxygen Status: Delivered bedside Referral coordination status: CM sent referral DME Liaison Name and Phone Number: [phone] DME referral accepted, selected and confirmation received through NH Link: Yes  Discussed readiness, willingness and ability to provide or support self-management activities when needed after discharge from the acute care setting with: Patient   Discharge Prescription Assistance: Voucher   Transportation   Does the patient need discharge transport arranged?: No   Who is arranging transport?: friend Mode of transportation?: private vehicle    Accepted Agency   Selected Continued Care - Admitted Since 08/09/2024     Durable Medical Equipment Coordination complete.    Service Provider Services Address Phone   [facility]  [facility] Equipment [address], [place] [address] [phone]           [facility] and oxygen delivered at bedside from [place] [phone]). CM placed medication voucher, no further CM needs at this time. Case Management has assessed this patient/family or caregiver's readiness, willingness and ability to provide or support self-management activities when needed after discharge from the acute care setting home.       Electronically signed: Elston Search 08/12/2024 9:57 AM

## 2024-08-12 NOTE — Care Plan (Signed)
°  Problem: NH GAS EXCHANGE-IMPAIRED Goal: Adequate oxygenation Outcome: Progressing Goal: Adequate ventilation Outcome: Progressing   Problem: Deep Venous Thrombosis, Risk of Goal: Absence of deep venous thrombosis Outcome: Progressing   Problem: Bleeding, Risk of Goal: Absence of active bleeding Outcome: Progressing Goal: Absence of impaired coagulation signs and symptoms Outcome: Progressing

## 2024-08-12 NOTE — Progress Notes (Signed)
 Pt not in room for neb

## 2024-08-16 NOTE — Progress Notes (Signed)
 CARE COORDINATOR CONTACT WITH PATIENT/CAREGIVER AFTER HOSPITAL DISCHARGE   I have been unable to reach patient for hospital follow up. Attempt to reach Pt multiple times. CC nurse left generic, HIPAA-compliant voicemail with CC nurse contact information on Pt M#. MyChart message sent.  Hello, Benjamin Casey, this is Misty,RN from [employer]. I tried reaching you to check in on your progress after your recent visit, but I was unable to get in touch. A MyChart message has been sent. Please give us  a call back at [phone] at your earliest convenience. Unfortunately, you cannot respond to this message at this time.

## 2024-08-17 ENCOUNTER — Emergency Department (HOSPITAL_COMMUNITY): Payer: Self-pay

## 2024-08-17 ENCOUNTER — Inpatient Hospital Stay (HOSPITAL_COMMUNITY)
Admission: EM | Admit: 2024-08-17 | Discharge: 2024-08-28 | DRG: 208 | Disposition: A | Discharge status: Home / Self Care | Payer: Self-pay | Attending: Internal Medicine | Admitting: Internal Medicine

## 2024-08-17 DIAGNOSIS — Z87891 Personal history of nicotine dependence: Secondary | ICD-10-CM | POA: Diagnosis not present

## 2024-08-17 DIAGNOSIS — R7989 Other specified abnormal findings of blood chemistry: Secondary | ICD-10-CM | POA: Diagnosis not present

## 2024-08-17 DIAGNOSIS — Z22322 Carrier or suspected carrier of Methicillin resistant Staphylococcus aureus: Secondary | ICD-10-CM

## 2024-08-17 DIAGNOSIS — J9622 Acute and chronic respiratory failure with hypercapnia: Secondary | ICD-10-CM | POA: Diagnosis not present

## 2024-08-17 DIAGNOSIS — Z5971 Insufficient health insurance coverage: Secondary | ICD-10-CM

## 2024-08-17 DIAGNOSIS — E66811 Obesity, class 1: Secondary | ICD-10-CM | POA: Diagnosis present

## 2024-08-17 DIAGNOSIS — Z6833 Body mass index (BMI) 33.0-33.9, adult: Secondary | ICD-10-CM

## 2024-08-17 DIAGNOSIS — I2489 Other forms of acute ischemic heart disease: Secondary | ICD-10-CM | POA: Diagnosis present

## 2024-08-17 DIAGNOSIS — Z713 Dietary counseling and surveillance: Secondary | ICD-10-CM | POA: Diagnosis not present

## 2024-08-17 DIAGNOSIS — Z91148 Patient's other noncompliance with medication regimen for other reason: Secondary | ICD-10-CM

## 2024-08-17 DIAGNOSIS — J9621 Acute and chronic respiratory failure with hypoxia: Principal | ICD-10-CM | POA: Diagnosis present

## 2024-08-17 DIAGNOSIS — E873 Alkalosis: Secondary | ICD-10-CM | POA: Diagnosis present

## 2024-08-17 DIAGNOSIS — J440 Chronic obstructive pulmonary disease with acute lower respiratory infection: Secondary | ICD-10-CM | POA: Diagnosis present

## 2024-08-17 DIAGNOSIS — Z720 Tobacco use: Secondary | ICD-10-CM

## 2024-08-17 DIAGNOSIS — J441 Chronic obstructive pulmonary disease with (acute) exacerbation: Principal | ICD-10-CM | POA: Diagnosis present

## 2024-08-17 DIAGNOSIS — Z79899 Other long term (current) drug therapy: Secondary | ICD-10-CM

## 2024-08-17 DIAGNOSIS — G9341 Metabolic encephalopathy: Secondary | ICD-10-CM | POA: Diagnosis present

## 2024-08-17 DIAGNOSIS — G9340 Encephalopathy, unspecified: Secondary | ICD-10-CM | POA: Diagnosis not present

## 2024-08-17 DIAGNOSIS — E669 Obesity, unspecified: Secondary | ICD-10-CM | POA: Diagnosis not present

## 2024-08-17 DIAGNOSIS — J302 Other seasonal allergic rhinitis: Secondary | ICD-10-CM | POA: Diagnosis present

## 2024-08-17 DIAGNOSIS — E875 Hyperkalemia: Secondary | ICD-10-CM | POA: Diagnosis present

## 2024-08-17 DIAGNOSIS — R0603 Acute respiratory distress: Secondary | ICD-10-CM | POA: Diagnosis not present

## 2024-08-17 DIAGNOSIS — R0902 Hypoxemia: Secondary | ICD-10-CM

## 2024-08-17 DIAGNOSIS — R5381 Other malaise: Secondary | ICD-10-CM | POA: Diagnosis present

## 2024-08-17 DIAGNOSIS — Z833 Family history of diabetes mellitus: Secondary | ICD-10-CM

## 2024-08-17 DIAGNOSIS — I10 Essential (primary) hypertension: Secondary | ICD-10-CM

## 2024-08-17 DIAGNOSIS — D751 Secondary polycythemia: Secondary | ICD-10-CM | POA: Diagnosis present

## 2024-08-17 DIAGNOSIS — J101 Influenza due to other identified influenza virus with other respiratory manifestations: Principal | ICD-10-CM | POA: Diagnosis present

## 2024-08-17 DIAGNOSIS — Z8249 Family history of ischemic heart disease and other diseases of the circulatory system: Secondary | ICD-10-CM

## 2024-08-17 LAB — CBC WITH DIFFERENTIAL/PLATELET
Abs Immature Granulocytes: 0.24 K/uL — ABNORMAL HIGH (ref 0.00–0.07)
Basophils Absolute: 0 K/uL (ref 0.0–0.1)
Basophils Relative: 0 %
Eosinophils Absolute: 0 K/uL (ref 0.0–0.5)
Eosinophils Relative: 0 %
HCT: 56.1 % — ABNORMAL HIGH (ref 39.0–52.0)
Hemoglobin: 17.2 g/dL — ABNORMAL HIGH (ref 13.0–17.0)
Immature Granulocytes: 2 %
Lymphocytes Relative: 10 %
Lymphs Abs: 1.2 K/uL (ref 0.7–4.0)
MCH: 30.2 pg (ref 26.0–34.0)
MCHC: 30.7 g/dL (ref 30.0–36.0)
MCV: 98.6 fL (ref 80.0–100.0)
Monocytes Absolute: 1.3 K/uL — ABNORMAL HIGH (ref 0.1–1.0)
Monocytes Relative: 11 %
Neutro Abs: 9.8 K/uL — ABNORMAL HIGH (ref 1.7–7.7)
Neutrophils Relative %: 77 %
Platelets: 181 K/uL (ref 150–400)
RBC: 5.69 MIL/uL (ref 4.22–5.81)
RDW: 14.6 % (ref 11.5–15.5)
WBC: 12.6 K/uL — ABNORMAL HIGH (ref 4.0–10.5)
nRBC: 0.2 % (ref 0.0–0.2)

## 2024-08-17 LAB — COMPREHENSIVE METABOLIC PANEL WITH GFR
ALT: 46 U/L — ABNORMAL HIGH (ref 0–44)
AST: 66 U/L — ABNORMAL HIGH (ref 15–41)
Albumin: 4.1 g/dL (ref 3.5–5.0)
Alkaline Phosphatase: 84 U/L (ref 38–126)
Anion gap: 9 (ref 5–15)
BUN: 30 mg/dL — ABNORMAL HIGH (ref 6–20)
CO2: 34 mmol/L — ABNORMAL HIGH (ref 22–32)
Calcium: 9.1 mg/dL (ref 8.9–10.3)
Chloride: 87 mmol/L — ABNORMAL LOW (ref 98–111)
Creatinine, Ser: 1.09 mg/dL (ref 0.61–1.24)
GFR, Estimated: >60 mL/min (ref >60)
Glucose, Bld: 123 mg/dL — ABNORMAL HIGH (ref 70–99)
Potassium: 5.6 mmol/L — ABNORMAL HIGH (ref 3.5–5.1)
Sodium: 130 mmol/L — ABNORMAL LOW (ref 135–145)
Total Bilirubin: 0.5 mg/dL (ref 0.0–1.2)
Total Protein: 7.7 g/dL (ref 6.5–8.1)

## 2024-08-17 LAB — CBC
HCT: 52.2 % — ABNORMAL HIGH (ref 39.0–52.0)
Hemoglobin: 16.1 g/dL (ref 13.0–17.0)
MCH: 30 pg (ref 26.0–34.0)
MCHC: 30.8 g/dL (ref 30.0–36.0)
MCV: 97.2 fL (ref 80.0–100.0)
Platelets: 169 K/uL (ref 150–400)
RBC: 5.37 MIL/uL (ref 4.22–5.81)
RDW: 14.6 % (ref 11.5–15.5)
WBC: 9.8 K/uL (ref 4.0–10.5)
nRBC: 0.4 % — ABNORMAL HIGH (ref 0.0–0.2)

## 2024-08-17 LAB — I-STAT CHEM 8, ED
BUN: 37 mg/dL — ABNORMAL HIGH (ref 6–20)
Calcium, Ion: 1.05 mmol/L — ABNORMAL LOW (ref 1.15–1.40)
Chloride: 88 mmol/L — ABNORMAL LOW (ref 98–111)
Creatinine, Ser: 1.2 mg/dL (ref 0.61–1.24)
Glucose, Bld: 126 mg/dL — ABNORMAL HIGH (ref 70–99)
HCT: 56 % — ABNORMAL HIGH (ref 39.0–52.0)
Hemoglobin: 19 g/dL — ABNORMAL HIGH (ref 13.0–17.0)
Potassium: 5.3 mmol/L — ABNORMAL HIGH (ref 3.5–5.1)
Sodium: 131 mmol/L — ABNORMAL LOW (ref 135–145)
TCO2: 39 mmol/L — ABNORMAL HIGH (ref 22–32)

## 2024-08-17 LAB — I-STAT VENOUS BLOOD GAS, ED
Acid-Base Excess: 11 mmol/L — ABNORMAL HIGH (ref 0.0–2.0)
Bicarbonate: 42.4 mmol/L — ABNORMAL HIGH (ref 20.0–28.0)
Calcium, Ion: 0.96 mmol/L — ABNORMAL LOW (ref 1.15–1.40)
HCT: 54 % — ABNORMAL HIGH (ref 39.0–52.0)
Hemoglobin: 18.4 g/dL — ABNORMAL HIGH (ref 13.0–17.0)
O2 Saturation: 79 %
Potassium: 5.4 mmol/L — ABNORMAL HIGH (ref 3.5–5.1)
Sodium: 129 mmol/L — ABNORMAL LOW (ref 135–145)
TCO2: 45 mmol/L — ABNORMAL HIGH (ref 22–32)
pCO2, Ven: 85.4 mmHg (ref 44–60)
pH, Ven: 7.304 (ref 7.25–7.43)
pO2, Ven: 50 mmHg — ABNORMAL HIGH (ref 32–45)

## 2024-08-17 LAB — I-STAT ARTERIAL BLOOD GAS, ED
Acid-Base Excess: 12 mmol/L — ABNORMAL HIGH (ref 0.0–2.0)
Bicarbonate: 41.7 mmol/L — ABNORMAL HIGH (ref 20.0–28.0)
Calcium, Ion: 1.06 mmol/L — ABNORMAL LOW (ref 1.15–1.40)
HCT: 55 % — ABNORMAL HIGH (ref 39.0–52.0)
Hemoglobin: 18.7 g/dL — ABNORMAL HIGH (ref 13.0–17.0)
O2 Saturation: 100 %
Potassium: 4.5 mmol/L (ref 3.5–5.1)
Sodium: 131 mmol/L — ABNORMAL LOW (ref 135–145)
TCO2: 44 mmol/L — ABNORMAL HIGH (ref 22–32)
pCO2 arterial: 69.7 mmHg (ref 32–48)
pH, Arterial: 7.385 (ref 7.35–7.45)
pO2, Arterial: 449 mmHg — ABNORMAL HIGH (ref 83–108)

## 2024-08-17 LAB — CREATININE, SERUM
Creatinine, Ser: 1.09 mg/dL (ref 0.61–1.24)
GFR, Estimated: >60 mL/min (ref >60)

## 2024-08-17 LAB — ETHANOL: Alcohol, Ethyl (B): <15 mg/dL (ref <15)

## 2024-08-17 LAB — RESP PANEL BY RT-PCR (RSV, FLU A&B, COVID)  RVPGX2
Influenza A by PCR: POSITIVE — AB
Influenza B by PCR: NEGATIVE
Resp Syncytial Virus by PCR: NEGATIVE
SARS Coronavirus 2 by RT PCR: NEGATIVE

## 2024-08-17 LAB — GLUCOSE, CAPILLARY
Glucose-Capillary: 170 mg/dL — ABNORMAL HIGH (ref 70–99)
Glucose-Capillary: 193 mg/dL — ABNORMAL HIGH (ref 70–99)

## 2024-08-17 LAB — D-DIMER, QUANTITATIVE: D-Dimer, Quant: 0.45 ug{FEU}/mL (ref 0.00–0.50)

## 2024-08-17 LAB — I-STAT CG4 LACTIC ACID, ED
Lactic Acid, Venous: 1.5 mmol/L (ref 0.5–1.9)
Lactic Acid, Venous: 1.9 mmol/L (ref 0.5–1.9)

## 2024-08-17 LAB — PRO BRAIN NATRIURETIC PEPTIDE: Pro Brain Natriuretic Peptide: 2777 pg/mL — ABNORMAL HIGH (ref <300.0)

## 2024-08-17 LAB — HIV ANTIBODY (ROUTINE TESTING W REFLEX): HIV Screen 4th Generation wRfx: NONREACTIVE

## 2024-08-17 LAB — TROPONIN T, HIGH SENSITIVITY
Troponin T High Sensitivity: 27 ng/L — ABNORMAL HIGH (ref 0–19)
Troponin T High Sensitivity: 27 ng/L — ABNORMAL HIGH (ref 0–19)
Troponin T High Sensitivity: 26 ng/L — ABNORMAL HIGH (ref 0–19)

## 2024-08-17 LAB — PROCALCITONIN: Procalcitonin: 0.11 ng/mL

## 2024-08-17 MED ORDER — DOCUSATE SODIUM 50 MG/5ML PO LIQD: 100.0000 mg | Freq: Two times a day (BID) | ORAL | Status: DC | PRN

## 2024-08-17 MED ORDER — FENTANYL CITRATE (PF) 50 MCG/ML IJ SOSY
25.0000 ug | PREFILLED_SYRINGE | Freq: Once | INTRAMUSCULAR | Status: AC
  Administered 2024-08-17: 23:00:00 25 ug via INTRAVENOUS

## 2024-08-17 MED ORDER — SODIUM CHLORIDE 0.9 % IV SOLN
500.0000 mg | INTRAVENOUS | Status: DC
  Administered 2024-08-17: 19:00:00 500 mg via INTRAVENOUS
  Filled 2024-08-17: qty 5

## 2024-08-17 MED ORDER — ROCURONIUM BROMIDE 10 MG/ML (PF) SYRINGE
PREFILLED_SYRINGE | INTRAVENOUS | Status: AC
  Filled 2024-08-17: qty 10

## 2024-08-17 MED ORDER — FENTANYL CITRATE (PF) 50 MCG/ML IJ SOSY: 50.0000 ug | PREFILLED_SYRINGE | INTRAMUSCULAR | Status: DC | PRN

## 2024-08-17 MED ORDER — PANTOPRAZOLE SODIUM 40 MG IV SOLR
40.0000 mg | Freq: Every day | INTRAVENOUS | Status: DC
  Administered 2024-08-17 – 2024-08-19 (×3): 40 mg via INTRAVENOUS
  Filled 2024-08-17 (×2): qty 10

## 2024-08-17 MED ORDER — PROPOFOL 1000 MG/100ML IV EMUL
INTRAVENOUS | Status: AC
  Filled 2024-08-17: qty 100

## 2024-08-17 MED ORDER — OSELTAMIVIR PHOSPHATE 75 MG PO CAPS
75.0000 mg | ORAL_CAPSULE | Freq: Two times a day (BID) | ORAL | Status: DC
  Administered 2024-08-18: 75 mg
  Filled 2024-08-17: qty 1

## 2024-08-17 MED ORDER — BUDESONIDE 0.25 MG/2ML IN SUSP
0.2500 mg | Freq: Two times a day (BID) | RESPIRATORY_TRACT | Status: DC
  Administered 2024-08-17 – 2024-08-28 (×22): 0.25 mg via RESPIRATORY_TRACT
  Filled 2024-08-17 (×21): qty 2

## 2024-08-17 MED ORDER — POLYETHYLENE GLYCOL 3350 17 G PO PACK: 17.0000 g | PACK | Freq: Every day | ORAL | Status: DC | PRN

## 2024-08-17 MED ORDER — POLYETHYLENE GLYCOL 3350 17 G PO PACK
17.0000 g | PACK | Freq: Every day | ORAL | Status: DC
  Administered 2024-08-18 – 2024-08-19 (×2): 17 g
  Filled 2024-08-17 (×2): qty 1

## 2024-08-17 MED ORDER — REVEFENACIN 175 MCG/3ML IN SOLN
175.0000 ug | Freq: Every day | RESPIRATORY_TRACT | Status: DC
  Administered 2024-08-18 – 2024-08-28 (×10): 175 ug via RESPIRATORY_TRACT
  Filled 2024-08-17 (×8): qty 3

## 2024-08-17 MED ORDER — INSULIN ASPART 100 UNIT/ML IJ SOLN
0.0000 [IU] | INTRAMUSCULAR | Status: DC
  Administered 2024-08-18 (×4): 2 [IU] via SUBCUTANEOUS
  Administered 2024-08-18: 3 [IU] via SUBCUTANEOUS
  Administered 2024-08-18 (×2): 2 [IU] via SUBCUTANEOUS
  Administered 2024-08-19: 3 [IU] via SUBCUTANEOUS
  Administered 2024-08-19 – 2024-08-20 (×4): 2 [IU] via SUBCUTANEOUS
  Administered 2024-08-20: 1 [IU] via SUBCUTANEOUS
  Administered 2024-08-20: 3 [IU] via SUBCUTANEOUS
  Filled 2024-08-17: qty 2
  Filled 2024-08-17: qty 5
  Filled 2024-08-17 (×4): qty 2
  Filled 2024-08-17: qty 5
  Filled 2024-08-17: qty 2
  Filled 2024-08-17: qty 1
  Filled 2024-08-17 (×2): qty 2
  Filled 2024-08-17: qty 3
  Filled 2024-08-17: qty 2

## 2024-08-17 MED ORDER — ALBUTEROL SULFATE (2.5 MG/3ML) 0.083% IN NEBU: 10.0000 mg/h | INHALATION_SOLUTION | Freq: Once | RESPIRATORY_TRACT | Status: DC

## 2024-08-17 MED ORDER — PROPOFOL 1000 MG/100ML IV EMUL
15.0000 ug/kg/min | INTRAVENOUS | Status: DC
  Administered 2024-08-17: 16:00:00 15 ug/kg/min via INTRAVENOUS

## 2024-08-17 MED ORDER — METHYLPREDNISOLONE SODIUM SUCC 125 MG IJ SOLR
60.0000 mg | Freq: Every day | INTRAMUSCULAR | Status: DC
  Administered 2024-08-17 – 2024-08-18 (×2): 60 mg via INTRAVENOUS
  Filled 2024-08-17 (×2): qty 2

## 2024-08-17 MED ORDER — ETOMIDATE 2 MG/ML IV SOLN
INTRAVENOUS | Status: AC | PRN
  Administered 2024-08-17: 15:00:00 20 mg via INTRAVENOUS

## 2024-08-17 MED ORDER — DOCUSATE SODIUM 100 MG PO CAPS: 100.0000 mg | ORAL_CAPSULE | Freq: Two times a day (BID) | ORAL | Status: DC | PRN

## 2024-08-17 MED ORDER — IPRATROPIUM BROMIDE 0.02 % IN SOLN
RESPIRATORY_TRACT | Status: AC
  Administered 2024-08-17: 14:00:00 0.5 mg
  Filled 2024-08-17: qty 2.5

## 2024-08-17 MED ORDER — FENTANYL BOLUS VIA INFUSION
25.0000 ug | INTRAVENOUS | Status: DC | PRN
  Administered 2024-08-18 (×2): 25 ug via INTRAVENOUS
  Administered 2024-08-18: 50 ug via INTRAVENOUS
  Administered 2024-08-18 (×2): 25 ug via INTRAVENOUS
  Administered 2024-08-19 (×3): 100 ug via INTRAVENOUS
  Administered 2024-08-19: 75 ug via INTRAVENOUS
  Administered 2024-08-20 (×2): 50 ug via INTRAVENOUS

## 2024-08-17 MED ORDER — DOCUSATE SODIUM 50 MG/5ML PO LIQD: 100.0000 mg | Freq: Two times a day (BID) | ORAL | Status: DC

## 2024-08-17 MED ORDER — DOCUSATE SODIUM 50 MG/5ML PO LIQD
100.0000 mg | Freq: Two times a day (BID) | ORAL | Status: DC
  Administered 2024-08-18 – 2024-08-19 (×4): 100 mg
  Filled 2024-08-17 (×4): qty 10

## 2024-08-17 MED ORDER — ALBUTEROL SULFATE (2.5 MG/3ML) 0.083% IN NEBU
INHALATION_SOLUTION | RESPIRATORY_TRACT | Status: AC
  Administered 2024-08-17: 14:00:00 5 mg via RESPIRATORY_TRACT
  Filled 2024-08-17: qty 6

## 2024-08-17 MED ORDER — ORAL CARE MOUTH RINSE
15.0000 mL | OROMUCOSAL | Status: DC
  Administered 2024-08-17 – 2024-08-20 (×29): 15 mL via OROMUCOSAL

## 2024-08-17 MED ORDER — POLYETHYLENE GLYCOL 3350 17 G PO PACK: 17.0000 g | PACK | Freq: Every day | ORAL | Status: DC

## 2024-08-17 MED ORDER — IPRATROPIUM-ALBUTEROL 0.5-2.5 (3) MG/3ML IN SOLN
3.0000 mL | Freq: Four times a day (QID) | RESPIRATORY_TRACT | Status: DC | PRN
  Administered 2024-08-20: 3 mL via RESPIRATORY_TRACT
  Filled 2024-08-17: qty 3

## 2024-08-17 MED ORDER — ORAL CARE MOUTH RINSE: 15.0000 mL | OROMUCOSAL | Status: DC | PRN

## 2024-08-17 MED ORDER — ETOMIDATE 2 MG/ML IV SOLN
INTRAVENOUS | Status: AC
  Filled 2024-08-17: qty 20

## 2024-08-17 MED ORDER — ALBUTEROL SULFATE (2.5 MG/3ML) 0.083% IN NEBU
10.0000 mg | INHALATION_SOLUTION | Freq: Once | RESPIRATORY_TRACT | Status: AC
  Administered 2024-08-17: 16:00:00 10 mg via RESPIRATORY_TRACT
  Filled 2024-08-17: qty 12

## 2024-08-17 MED ORDER — ROCURONIUM BROMIDE 10 MG/ML (PF) SYRINGE
PREFILLED_SYRINGE | INTRAVENOUS | Status: AC | PRN
  Administered 2024-08-17: 16:00:00 100 mg via INTRAVENOUS

## 2024-08-17 MED ORDER — HEPARIN SODIUM (PORCINE) 5000 UNIT/ML IJ SOLN
5000.0000 [IU] | Freq: Three times a day (TID) | INTRAMUSCULAR | Status: DC
  Administered 2024-08-17 – 2024-08-28 (×33): 5000 [IU] via SUBCUTANEOUS
  Filled 2024-08-17 (×34): qty 1

## 2024-08-17 MED ORDER — ARFORMOTEROL TARTRATE 15 MCG/2ML IN NEBU
15.0000 ug | INHALATION_SOLUTION | Freq: Two times a day (BID) | RESPIRATORY_TRACT | Status: DC
  Administered 2024-08-17 – 2024-08-28 (×21): 15 ug via RESPIRATORY_TRACT
  Filled 2024-08-17 (×20): qty 2

## 2024-08-17 MED ORDER — SODIUM CHLORIDE 0.9 % IV SOLN
1.0000 g | INTRAVENOUS | Status: AC
  Administered 2024-08-17 – 2024-08-21 (×5): 1 g via INTRAVENOUS
  Filled 2024-08-17 (×5): qty 10

## 2024-08-17 MED ORDER — PROPOFOL 1000 MG/100ML IV EMUL
INTRAVENOUS | Status: DC | PRN
  Administered 2024-08-17: 16:00:00 15 ug/kg/min via INTRAVENOUS

## 2024-08-17 MED ORDER — HYDRALAZINE HCL 25 MG PO TABS: 25.0000 mg | ORAL_TABLET | Freq: Three times a day (TID) | ORAL | Status: DC

## 2024-08-17 MED ORDER — MAGNESIUM SULFATE 2 GM/50ML IV SOLN
2.0000 g | Freq: Once | INTRAVENOUS | Status: AC
  Administered 2024-08-17: 14:00:00 2 g via INTRAVENOUS

## 2024-08-17 MED ORDER — CHLORHEXIDINE GLUCONATE CLOTH 2 % EX PADS
6.0000 | MEDICATED_PAD | Freq: Every day | CUTANEOUS | Status: DC
  Administered 2024-08-17 – 2024-08-28 (×7): 6 via TOPICAL

## 2024-08-17 MED ORDER — PROPOFOL 1000 MG/100ML IV EMUL: 0.0000 ug/kg/min | INTRAVENOUS | Status: DC

## 2024-08-17 MED ORDER — PROPOFOL 1000 MG/100ML IV EMUL
0.0000 ug/kg/min | INTRAVENOUS | Status: DC
  Administered 2024-08-17 (×2): 40 ug/kg/min via INTRAVENOUS
  Administered 2024-08-18: 30 ug/kg/min via INTRAVENOUS
  Administered 2024-08-18: 35 ug/kg/min via INTRAVENOUS
  Administered 2024-08-18: 25 ug/kg/min via INTRAVENOUS
  Administered 2024-08-18: 35 ug/kg/min via INTRAVENOUS
  Administered 2024-08-19 (×2): 30 ug/kg/min via INTRAVENOUS
  Administered 2024-08-19: 25 ug/kg/min via INTRAVENOUS
  Administered 2024-08-19: 30 ug/kg/min via INTRAVENOUS
  Administered 2024-08-19 – 2024-08-20 (×3): 25 ug/kg/min via INTRAVENOUS
  Filled 2024-08-17 (×3): qty 100
  Filled 2024-08-17: qty 200
  Filled 2024-08-17 (×5): qty 100
  Filled 2024-08-17: qty 200
  Filled 2024-08-17 (×3): qty 100

## 2024-08-17 MED ORDER — FENTANYL 2500MCG IN NS 250ML (10MCG/ML) PREMIX INFUSION
0.0000 ug/h | INTRAVENOUS | Status: DC
  Administered 2024-08-17: 23:00:00 25 ug/h via INTRAVENOUS
  Administered 2024-08-20: 50 ug/h via INTRAVENOUS
  Filled 2024-08-17 (×2): qty 250

## 2024-08-17 MED ADMIN — Fentanyl Citrate PF Soln Prefilled Syringe 50 MCG/ML: 50 ug | INTRAVENOUS | @ 18:00:00 | NDC 63323080801

## 2024-08-17 MED ADMIN — Fentanyl Citrate PF Soln Prefilled Syringe 50 MCG/ML: 50 ug | INTRAVENOUS | @ 17:00:00 | NDC 63323080801

## 2024-08-17 MED ADMIN — Fentanyl Citrate PF Soln Prefilled Syringe 50 MCG/ML: 100 ug | INTRAVENOUS | @ 20:00:00 | NDC 63323080801

## 2024-08-17 MED FILL — Fentanyl Citrate PF Soln Prefilled Syringe 50 MCG/ML: 50.0000 ug | INTRAMUSCULAR | Qty: 2 | Status: AC

## 2024-08-17 MED FILL — Propofol IV Emul 1000 MG/100ML (10 MG/ML): INTRAVENOUS | Qty: 100 | Status: AC

## 2024-08-17 NOTE — ED Provider Notes (Signed)
 Procedure Name: Intubation Date/Time: 08/17/2024 3:44 PM  Performed by: Benjamin Casey, MDPre-anesthesia Checklist: Patient identified, Patient being monitored, Emergency Drugs available and Timeout performed Oxygen Delivery Method: Ambu bag Preoxygenation: Pre-oxygenation with 100% oxygen Induction Type: Rapid sequence Ventilation: Mask ventilation without difficulty Laryngoscope Size: Glidescope Grade View: Grade II Tube size: 7.5 mm Number of attempts: 1 Airway Equipment and Method: Video-laryngoscopy Placement Confirmation: ETT inserted through vocal cords under direct vision, Positive ETCO2, CO2 detector and Breath sounds checked- equal and bilateral Secured at: 25 (lip) cm Tube secured with: ETT holder Dental Injury: Teeth and Oropharynx as per pre-operative assessment      Benjamin Casey Guillermina MD, PGY-2   Benjamin Hamilton, MD 08/17/24 1546    Benjamin Lamar JAYSON, MD 08/17/24 [phone]

## 2024-08-17 NOTE — Progress Notes (Signed)
 eLink Physician-Brief Progress Note Patient Name: Benjamin Casey DOB: [birth year] MRN: [id number]   Date of Service  08/17/2024  HPI/Events of Note  Patient with a history of COPD with recent admission for exacerbation who presented with acute hypoxemic respiratory failure requiring intubation and mechanical ventilation, in the context of influenza A pneumonia.  eICU Interventions  New Patient Evaluation. SSI orders entered. Airborne precautions, Fentanyl  gtt.        Jodine Muchmore U Indie Boehne 08/17/2024, 9:52 PM

## 2024-08-17 NOTE — ED Provider Notes (Signed)
°  Physical Exam  BP (!) 129/93   Pulse 95   Resp (!) 27   SpO2 95%   Physical Exam  Procedures  Procedures  ED Course / MDM   Clinical Course as of 08/17/24 1716  Thu Aug 17, 2024  1523 Assumed care from Dr Dasie. 53 yo M history of COPD who presented to the emergency department respiratory distress after COPD exacerbation.  Mental status continues to worsen.  Oral intubated for airway protection. Got solumedrol prior to arrival.  [RP]  1538 Mental status worsening on BiPAP.  Unarousable.  Patient intubated for airway protection.  See procedure note for additional details. [RP]  1629 Discussed with Tinnie Furth PA from critical care who will evaluate for admission.  Suspect COPD exacerbation from influenza A.  Could have a component of heart failure exacerbation as well. [RP]  1716 Admitted to the ICU [RP]    Clinical Course User Index [RP] Yolande Lamar BROCKS, MD   Medical Decision Making Amount and/or Complexity of Data Reviewed Labs: ordered. Radiology: ordered.  Risk Prescription drug management. Decision regarding hospitalization.         Yolande Lamar BROCKS, MD 08/17/24 [phone]

## 2024-08-17 NOTE — Progress Notes (Deleted)
 NAME:  Benjamin Casey, MRN:  [id number], DOB:  27-Aug-1971, LOS: 0 ADMISSION DATE:  08/17/2024, CONSULTATION DATE:  08/17/2024 REFERRING MD:  Dr. Yolande, CHIEF COMPLAINT:  respiratory distress   History of Present Illness:  Patient intubated and sedated all information obtained via chart review and family at bedside  [name] is a 53 year old male with history of COPD, HTN, former tobacco abuse who presented to ED by EMS with respiratory distress. On arrival EMS found patient's SpO2 to be 41% on room air. Per patient's aunt his roommate called her earlier today saying that he needed to go to the hospital. When she arrived at the house she found that he was blue in the face and lips, breathing heavily and murmuring incomprehensible sounds only. She does not know if he had any upper respiratory infections prior to becoming acutely ill. He is a former heavy smoker who quit about 15 years ago, former drug use and no alcohol use. He does not wear home oxygen.    He was recently admitted for [facility] from 12/13-12/18 for COPD exacerbation. At that time he was unable to get home O2 given lack of insurance. He was also prescribed steroid taper, duonebs and symbicort. He was referred to a pulmonologist but his aunt does not think he ever went. She also does not think he was compliant with his medications after discharge.   Pertinent  Medical History  COPD HTN Former tobacco abuse   Significant Hospital Events: Including procedures, antibiotic start and stop dates in addition to other pertinent events   08/17/2024: admitted for respiratory distress, COPD exacerbation, Flu A+. Intubated in the ED   Interim History / Subjective:  Admitted for respiratory distress intubated in the ED. Hemodynamically stable on propofol  infusion.   Objective    Blood pressure (!) 158/110, pulse 91, temperature 97.7 F (36.5 C), resp. rate 20, height 5' 6 (1.676 m), weight 106 kg, SpO2 100%.    Vent Mode:  PRVC FiO2 (%):  [50 %-100 %] 50 % Set Rate:  [20 bmp] 20 bmp Vt Set:  [510 mL] 510 mL PEEP:  [8 cmH20] 8 cmH20 Plateau Pressure:  [18 cmH20] 18 cmH20   Intake/Output Summary (Last 24 hours) at 08/17/2024 1723 Last data filed at 08/17/2024 1554 Gross per 24 hour  Intake --  Output 150 ml  Net -150 ml   Filed Weights   08/17/24 1645  Weight: 106 kg    Examination: General: acute on chronically ill appearing male, intubated HENT: [place]/AT, sclera with burst capillaries, PERRL Lungs: on mechanical ventilation, coarse  Cardiovascular: regular rate and rhythm  Abdomen: soft, non-tender, non-distended  Extremities: warm, dry, no edema  Neuro: intubated and sedated, PERRL, moves all extremities spontaneously, +cough/gag GU: deferred  Labs/imaging reviewed   Resolved problem list   Assessment and Plan   Acute on chronic respiratory failure with hypoxia and hypercarbia, multifactorial due to below COPD exacerbation  Influenza A infection - cont full LTVV support, 4-8cc/kg IBW with goal Pplat <30 and DP<15  - VAP prevention protocol/ PPI - intermittent CXR/ ABG - wean FiO2 as able for SpO2>88% - daily SAT & SBT when appropriate  - aggressive pulmonary hygiene - start triple therapy with LAMA/LABA/ICS, PRN Duo-nebs - solumedrol 60 mg daily - send respiratory culture and pro-calcitonin, start azithromycin  and ceftriaxone  - tamiflu   Encephalopathy, likely due to hypercarbia and hypoxia - SAT when able  - continue PAD protocol with propofol  and PRN fentanyl  for now  Elevated BNP Euvolemic on exam.  - Obtain Echo, may need to consider diuresis  Elevated troponin, likely due to demand ischemia ECG (personally read) NSR with T wave inversion in aVL, aVR and V1. Likely demand ischemia.  - Trend troponin   History of HTN - resume PTA hydralazine , holding PTA lisinopril  given hyperkalemia   Polycythemia, likely due to chronic hypoxemia - Continue to monitor daily CBC  Labs    CBC: Recent Labs  Lab 08/17/24 1415 08/17/24 1426 08/17/24 1620 08/17/24 1632  WBC 12.6*  --   --   --   NEUTROABS 9.8*  --   --   --   HGB 17.2* 19.0* 18.4* 18.7*  HCT 56.1* 56.0* 54.0* 55.0*  MCV 98.6  --   --   --   PLT 181  --   --   --     Basic Metabolic Panel: Recent Labs  Lab 08/17/24 1415 08/17/24 1426 08/17/24 1620 08/17/24 1632  NA 130* 131* 129* 131*  K 5.6* 5.3* 5.4* 4.5  CL 87* 88*  --   --   CO2 34*  --   --   --   GLUCOSE 123* 126*  --   --   BUN 30* 37*  --   --   CREATININE 1.09 1.20  --   --   CALCIUM 9.1  --   --   --    GFR: Estimated Creatinine Clearance: 80.3 mL/min (by C-G formula based on SCr of 1.2 mg/dL). Recent Labs  Lab 08/17/24 1415 08/17/24 1426 08/17/24 1622  WBC 12.6*  --   --   LATICACIDVEN  --  1.5 1.9    Liver Function Tests: Recent Labs  Lab 08/17/24 1415  AST 66*  ALT 46*  ALKPHOS 84  BILITOT 0.5  PROT 7.7  ALBUMIN 4.1   No results for input(s): LIPASE, AMYLASE in the last 168 hours. No results for input(s): AMMONIA in the last 168 hours.  ABG    Component Value Date/Time   PHART 7.385 08/17/2024 1632   PCO2ART 69.7 (HH) 08/17/2024 1632   PO2ART 449 (H) 08/17/2024 1632   HCO3 41.7 (H) 08/17/2024 1632   TCO2 44 (H) 08/17/2024 1632   O2SAT 100 08/17/2024 1632     Coagulation Profile: No results for input(s): INR, PROTIME in the last 168 hours.  Cardiac Enzymes: No results for input(s): CKTOTAL, CKMB, CKMBINDEX, TROPONINI in the last 168 hours.  HbA1C: HB A1C (BAYER DCA - WAIVED)  Date/Time Value Ref Range Status  09/24/2023 09:31 AM 6.5 (H) 4.8 - 5.6 % Final    Comment:             Prediabetes: 5.7 - 6.4          Diabetes: >6.4          Glycemic control for adults with diabetes: <7.0     CBG: No results for input(s): GLUCAP in the last 168 hours.  Review of Systems:   Unable to obtain patient intubated and sedated  Past Medical History:  He,  has a past medical history  of Anxiety, COPD (chronic obstructive pulmonary disease) (HCC), and Hypertension.   Surgical History:  No past surgical history on file.   Social History:   reports that he quit smoking about 3 years ago. His smoking use included cigarettes. His smokeless tobacco use includes chew. He reports that he does not currently use alcohol. He reports that he does not currently use drugs after having used  the following drugs: Marijuana.   Family History:  His family history includes COPD in his father and mother; Diabetes in his mother; Hypertension in his brother and mother.   Allergies Allergies[1]   Home Medications  Prior to Admission medications  Medication Sig Start Date End Date Taking? Authorizing Provider  albuterol  (PROVENTIL ) (2.5 MG/3ML) 0.083% nebulizer solution Take 3 mLs (2.5 mg total) by nebulization every 6 (six) hours as needed for wheezing or shortness of breath. 09/24/23   Milian, Marry Lenis, FNP  albuterol  (VENTOLIN  HFA) 108 (90 Base) MCG/ACT inhaler INHALE 1 PUFF BY MOUTH AS NEEDED. APPOINTMENT NEEDED FOR ADDITIONAL REFILLS. 10/27/23   MilianMarry Lenis, FNP  Budeson-Glycopyrrol-Formoterol (BREZTRI  AEROSPHERE) 160-9-4.8 MCG/ACT AERO Inhale 2 puffs into the lungs 2 (two) times daily. 08/13/23   MilianMarry Lenis, FNP  furosemide  (LASIX ) 20 MG tablet Take 1 tablet by mouth once daily 11/22/23   Milian, Marry Lenis, FNP  hydrALAZINE  (APRESOLINE ) 25 MG tablet Take 1 tablet (25 mg total) by mouth 3 (three) times daily. 08/13/23   Cathlene Marry Lenis, FNP  lisinopril  (ZESTRIL ) 40 MG tablet Take 1 tablet by mouth once daily 11/22/23   Milian, Marry Lenis, FNP     Critical care time: 35 minutes    The patient is critically ill with multiple organ system failure and requires high complexity decision making for assessment and support, frequent evaluation and titration of therapies, advanced monitoring, review of radiographic studies and interpretation of  complex data.   Critical Care Time devoted to patient care services, exclusive of separately billable procedures, described in this note is 35 minutes.  Rexene LOISE Blush, PA-C  Pulmonary & Critical Care 08/17/2024 5:49 PM  Please see [email or web address] for pager details.  From 7A-7P if no response, please call [phone] After hours, please call ELink [phone]          [1]  Allergies Allergen Reactions   Seasonal Ic [Octacosanol]

## 2024-08-17 NOTE — ED Triage Notes (Signed)
 Pt arrives via [facility] for resp distress. Pt recently discharged from NH for similar issues on home oxygen. Pt was not on home O2 on arrival and initial O2 sat was 41% on RA per EMS. Given 2 duoneb tx and 125 mg Solumedrol enroute.

## 2024-08-17 NOTE — H&P (Signed)
 NAME:  Benjamin Casey, MRN:  [id number], DOB:  27-Sep-1970, LOS: 0 ADMISSION DATE:  08/17/2024, CONSULTATION DATE:  08/17/2024 REFERRING MD:  Dr. Yolande, CHIEF COMPLAINT:  respiratory distress   History of Present Illness:  Patient intubated and sedated all information obtained via chart review and family at bedside  Benjamin Casey is a 53 year old male with history of COPD, HTN, former tobacco abuse who presented to ED by EMS with respiratory distress. On arrival EMS found patient's SpO2 to be 41% on room air. Per patient's aunt his roommate called her earlier today saying that he needed to go to the hospital. When she arrived at the house she found that he was blue in the face and lips, breathing heavily and murmuring incomprehensible sounds only. She does not know if he had any upper respiratory infections prior to becoming acutely ill. He is a former heavy smoker who quit about 15 years ago, former drug use and no alcohol use. He does not wear home oxygen.    He was recently admitted for [facility] from 12/13-12/18 for COPD exacerbation. At that time he was unable to get home O2 given lack of insurance. He was also prescribed steroid taper, duonebs and symbicort. He was referred to a pulmonologist but his aunt does not think he ever went. She also does not think he was compliant with his medications after discharge.   Pertinent  Medical History  COPD HTN Former tobacco abuse   Significant Hospital Events: Including procedures, antibiotic start and stop dates in addition to other pertinent events   08/17/2024: admitted for respiratory distress, COPD exacerbation, Flu A+. Intubated in the ED   Interim History / Subjective:  Admitted for respiratory distress intubated in the ED. Hemodynamically stable on propofol  infusion.   Objective    Blood pressure (!) 158/110, pulse 91, temperature 97.7 F (36.5 C), resp. rate 20, height 5' 6 (1.676 m), weight 106 kg, SpO2 100%.    Vent Mode:  PRVC FiO2 (%):  [50 %-100 %] 50 % Set Rate:  [20 bmp] 20 bmp Vt Set:  [510 mL] 510 mL PEEP:  [8 cmH20] 8 cmH20 Plateau Pressure:  [18 cmH20] 18 cmH20   Intake/Output Summary (Last 24 hours) at 08/17/2024 1751 Last data filed at 08/17/2024 1554 Gross per 24 hour  Intake --  Output 150 ml  Net -150 ml   Filed Weights   08/17/24 1645  Weight: 106 kg    Examination: General: acute on chronically ill appearing male, intubated HENT: [place]/AT, sclera with burst capillaries, PERRL Lungs: on mechanical ventilation, coarse  Cardiovascular: regular rate and rhythm  Abdomen: soft, non-tender, non-distended  Extremities: warm, dry, no edema  Neuro: intubated and sedated, PERRL, moves all extremities spontaneously, +cough/gag GU: deferred  Labs/imaging reviewed   Resolved problem list   Assessment and Plan   Acute on chronic respiratory failure with hypoxia and hypercarbia, multifactorial due to below COPD exacerbation  Influenza A infection - cont full LTVV support, 4-8cc/kg IBW with goal Pplat <30 and DP<15  - VAP prevention protocol/ PPI - intermittent CXR/ ABG - wean FiO2 as able for SpO2>88% - daily SAT & SBT when appropriate  - aggressive pulmonary hygiene - start triple therapy with LAMA/LABA/ICS, PRN Duo-nebs - solumedrol 60 mg daily - send respiratory culture and pro-calcitonin, start azithromycin  and ceftriaxone  - tamiflu   Encephalopathy, likely due to hypercarbia and hypoxia - SAT when able  - continue PAD protocol with propofol  and PRN fentanyl  for now  Elevated BNP Euvolemic on exam.  - Obtain Echo, may need to consider diuresis  Elevated troponin, likely due to demand ischemia ECG (personally read) NSR with T wave inversion in aVL, aVR and V1. Likely demand ischemia.  - Trend troponin   History of HTN - resume PTA hydralazine , holding PTA lisinopril  given hyperkalemia   Polycythemia, likely due to chronic hypoxemia - Continue to monitor daily CBC  Labs    CBC: Recent Labs  Lab 08/17/24 1415 08/17/24 1426 08/17/24 1620 08/17/24 1632  WBC 12.6*  --   --   --   NEUTROABS 9.8*  --   --   --   HGB 17.2* 19.0* 18.4* 18.7*  HCT 56.1* 56.0* 54.0* 55.0*  MCV 98.6  --   --   --   PLT 181  --   --   --     Basic Metabolic Panel: Recent Labs  Lab 08/17/24 1415 08/17/24 1426 08/17/24 1620 08/17/24 1632  NA 130* 131* 129* 131*  K 5.6* 5.3* 5.4* 4.5  CL 87* 88*  --   --   CO2 34*  --   --   --   GLUCOSE 123* 126*  --   --   BUN 30* 37*  --   --   CREATININE 1.09 1.20  --   --   CALCIUM  9.1  --   --   --    GFR: Estimated Creatinine Clearance: 80.3 mL/min (by C-G formula based on SCr of 1.2 mg/dL). Recent Labs  Lab 08/17/24 1415 08/17/24 1426 08/17/24 1622  WBC 12.6*  --   --   LATICACIDVEN  --  1.5 1.9    Liver Function Tests: Recent Labs  Lab 08/17/24 1415  AST 66*  ALT 46*  ALKPHOS 84  BILITOT 0.5  PROT 7.7  ALBUMIN 4.1   No results for input(s): LIPASE, AMYLASE in the last 168 hours. No results for input(s): AMMONIA in the last 168 hours.  ABG    Component Value Date/Time   PHART 7.385 08/17/2024 1632   PCO2ART 69.7 (HH) 08/17/2024 1632   PO2ART 449 (H) 08/17/2024 1632   HCO3 41.7 (H) 08/17/2024 1632   TCO2 44 (H) 08/17/2024 1632   O2SAT 100 08/17/2024 1632     Coagulation Profile: No results for input(s): INR, PROTIME in the last 168 hours.  Cardiac Enzymes: No results for input(s): CKTOTAL, CKMB, CKMBINDEX, TROPONINI in the last 168 hours.  HbA1C: HB A1C (BAYER DCA - WAIVED)  Date/Time Value Ref Range Status  09/24/2023 09:31 AM 6.5 (H) 4.8 - 5.6 % Final    Comment:             Prediabetes: 5.7 - 6.4          Diabetes: >6.4          Glycemic control for adults with diabetes: <7.0     CBG: No results for input(s): GLUCAP in the last 168 hours.  Review of Systems:   Unable to obtain patient intubated and sedated  Past Medical History:  He,  has a past medical history  of Anxiety, COPD (chronic obstructive pulmonary disease) (HCC), and Hypertension.   Surgical History:  No past surgical history on file.   Social History:   reports that he quit smoking about 3 years ago. His smoking use included cigarettes. His smokeless tobacco use includes chew. He reports that he does not currently use alcohol. He reports that he does not currently use drugs after having used  the following drugs: Marijuana.   Family History:  His family history includes COPD in his father and mother; Diabetes in his mother; Hypertension in his brother and mother.   Allergies Allergies[1]   Home Medications  Prior to Admission medications  Medication Sig Start Date End Date Taking? Authorizing Provider  albuterol  (PROVENTIL ) (2.5 MG/3ML) 0.083% nebulizer solution Take 3 mLs (2.5 mg total) by nebulization every 6 (six) hours as needed for wheezing or shortness of breath. 09/24/23   Milian, Marry Lenis, FNP  albuterol  (VENTOLIN  HFA) 108 (90 Base) MCG/ACT inhaler INHALE 1 PUFF BY MOUTH AS NEEDED. APPOINTMENT NEEDED FOR ADDITIONAL REFILLS. 10/27/23   MilianMarry Lenis, FNP  Budeson-Glycopyrrol-Formoterol (BREZTRI  AEROSPHERE) 160-9-4.8 MCG/ACT AERO Inhale 2 puffs into the lungs 2 (two) times daily. 08/13/23   Cathlene Marry Lenis, FNP  furosemide  (LASIX ) 20 MG tablet Take 1 tablet by mouth once daily 11/22/23   Milian, Marry Lenis, FNP  hydrALAZINE  (APRESOLINE ) 25 MG tablet Take 1 tablet (25 mg total) by mouth 3 (three) times daily. 08/13/23   Cathlene Marry Lenis, FNP  lisinopril  (ZESTRIL ) 40 MG tablet Take 1 tablet by mouth once daily 11/22/23   Milian, Marry Lenis, FNP     Critical care time: 35 minutes    The patient is critically ill with multiple organ system failure and requires high complexity decision making for assessment and support, frequent evaluation and titration of therapies, advanced monitoring, review of radiographic studies and interpretation of  complex data.   Critical Care Time devoted to patient care services, exclusive of separately billable procedures, described in this note is 35 minutes.  Rexene LOISE Blush, PA-C [facility] 08/17/2024 5:51 PM  Please see [email or web address] for pager details.  From 7A-7P if no response, please call [phone] After hours, please call ELink [phone]           [1]  Allergies Allergen Reactions   Seasonal Ic [Octacosanol]

## 2024-08-17 NOTE — ED Provider Notes (Signed)
 [place] EMERGENCY DEPARTMENT AT [facility] Provider Note   CSN: [id number] Arrival date & time: 08/17/24  1404     Patient presents with: Respiratory Distress   Benjamin Casey is a 53 y.o. male.   75 old who presents with shortness of breath.  Has history of COPD.  According to EMS, patient has had increasing trouble breathing today.  He was found to be severely hypoxic on arrival with a pulse oximetry in the 40s.  Was placed on nonrebreather and patient did improve.  Was also given albuterol , Solu-Medrol .  Review of her records show that patient had a recent hospitalization at [facility].  Patient was supposed to be on home oxygen.  No further history obtainable due to his current state       Prior to Admission medications  Medication Sig Start Date End Date Taking? Authorizing Provider  albuterol  (PROVENTIL ) (2.5 MG/3ML) 0.083% nebulizer solution Take 3 mLs (2.5 mg total) by nebulization every 6 (six) hours as needed for wheezing or shortness of breath. 09/24/23   Milian, Marry Lenis, FNP  albuterol  (VENTOLIN  HFA) 108 (90 Base) MCG/ACT inhaler INHALE 1 PUFF BY MOUTH AS NEEDED. APPOINTMENT NEEDED FOR ADDITIONAL REFILLS. 10/27/23   MilianMarry Lenis, FNP  Budeson-Glycopyrrol-Formoterol (BREZTRI  AEROSPHERE) 160-9-4.8 MCG/ACT AERO Inhale 2 puffs into the lungs 2 (two) times daily. 08/13/23   Cathlene Marry Lenis, FNP  furosemide  (LASIX ) 20 MG tablet Take 1 tablet by mouth once daily 11/22/23   Milian, Marry Lenis, FNP  hydrALAZINE  (APRESOLINE ) 25 MG tablet Take 1 tablet (25 mg total) by mouth 3 (three) times daily. 08/13/23   Cathlene Marry Lenis, FNP  lisinopril  (ZESTRIL ) 40 MG tablet Take 1 tablet by mouth once daily 11/22/23   Milian, Marry Lenis, FNP    Allergies: Seasonal ic [octacosanol]    Review of Systems  Unable to perform ROS: Acuity of condition    Updated Vital Signs BP (!) 156/105   Pulse 99   Resp (!) 24   SpO2 100%   Physical  Exam Vitals and nursing note reviewed.  Constitutional:      General: He is not in acute distress.    Appearance: Normal appearance. He is well-developed. He is not toxic-appearing.  HENT:     Head: Normocephalic and atraumatic.  Eyes:     General: Lids are normal.     Conjunctiva/sclera: Conjunctivae normal.     Pupils: Pupils are equal, round, and reactive to light.  Neck:     Thyroid : No thyroid  mass.     Trachea: No tracheal deviation.  Cardiovascular:     Rate and Rhythm: Normal rate and regular rhythm.     Heart sounds: Normal heart sounds. No murmur heard.    No gallop.  Pulmonary:     Effort: Prolonged expiration and respiratory distress present.     Breath sounds: No stridor. Decreased breath sounds and wheezing present. No rhonchi or rales.  Abdominal:     General: There is no distension.     Palpations: Abdomen is soft.     Tenderness: There is no abdominal tenderness. There is no rebound.  Musculoskeletal:        General: No tenderness. Normal range of motion.     Cervical back: Normal range of motion and neck supple.  Skin:    General: Skin is warm and dry.     Findings: No abrasion or rash.  Neurological:     Mental Status: He is alert and oriented to person,  place, and time. Mental status is at baseline.     GCS: GCS eye subscore is 4. GCS verbal subscore is 5. GCS motor subscore is 6.     Cranial Nerves: No cranial nerve deficit.     Sensory: No sensory deficit.     Motor: Motor function is intact.  Psychiatric:        Attention and Perception: He is inattentive.     (all labs ordered are listed, but only abnormal results are displayed) Labs Reviewed  CULTURE, BLOOD (ROUTINE X 2)  CULTURE, BLOOD (ROUTINE X 2)  RESP PANEL BY RT-PCR (RSV, FLU A&B, COVID)  RVPGX2  CBC WITH DIFFERENTIAL/PLATELET  COMPREHENSIVE METABOLIC PANEL WITH GFR  ETHANOL  D-DIMER, QUANTITATIVE  PRO BRAIN NATRIURETIC PEPTIDE  I-STAT CHEM 8, ED  I-STAT VENOUS BLOOD GAS, ED  I-STAT  CG4 LACTIC ACID, ED  TROPONIN T, HIGH SENSITIVITY    EKG: EKG Interpretation Date/Time:  Thursday August 17 2024 14:07:06 EST Ventricular Rate:  99 PR Interval:  82 QRS Duration:  102 QT Interval:  354 QTC Calculation: 455 R Axis:   -23  Text Interpretation: Confirmed by Dasie Faden ([id number]) on 08/17/2024 2:18:33 PM  Radiology: No results found.   Procedures   Medications Ordered in the ED  magnesium  sulfate IVPB 2 g 50 mL (2 g Intravenous New Bag/Given 08/17/24 1411)  ipratropium (ATROVENT ) 0.02 % nebulizer solution (0.5 mg  Given 08/17/24 1415)  albuterol  (PROVENTIL ) (2.5 MG/3ML) 0.083% nebulizer solution (5 mg  Given 08/17/24 1415)    Clinical Course as of 08/17/24 1532  Thu Aug 17, 2024  1523 Assumed care from Dr Dasie. 53 yo M history of COPD who presented to the emergency department respiratory distress after COPD exacerbation.  Mental status continues to worsen.  Oral intubated for airway protection. Got solumedrol prior to arrival.  [RP]    Clinical Course User Index [RP] Yolande Lamar BROCKS, MD                                 Medical Decision Making Amount and/or Complexity of Data Reviewed Labs: ordered. Radiology: ordered.  Risk Prescription drug management.   Patient given albuterol , magnesium , placed on BiPAP.  Patient's initial respiratory status improved however he did decompensate.  Patient will require intubation and care turned over to Dr. Jakie     Final diagnoses:  None    ED Discharge Orders     None          Dasie Faden, MD 08/17/24 1535

## 2024-08-18 ENCOUNTER — Inpatient Hospital Stay (HOSPITAL_COMMUNITY): Payer: Self-pay

## 2024-08-18 DIAGNOSIS — R0603 Acute respiratory distress: Secondary | ICD-10-CM | POA: Diagnosis not present

## 2024-08-18 LAB — POCT I-STAT 7, (LYTES, BLD GAS, ICA,H+H)
Acid-Base Excess: 17 mmol/L — ABNORMAL HIGH (ref 0.0–2.0)
Acid-Base Excess: 18 mmol/L — ABNORMAL HIGH (ref 0.0–2.0)
Bicarbonate: 45.2 mmol/L — ABNORMAL HIGH (ref 20.0–28.0)
Bicarbonate: 46.9 mmol/L — ABNORMAL HIGH (ref 20.0–28.0)
Calcium, Ion: 1.06 mmol/L — ABNORMAL LOW (ref 1.15–1.40)
Calcium, Ion: 1.06 mmol/L — ABNORMAL LOW (ref 1.15–1.40)
HCT: 49 % (ref 39.0–52.0)
HCT: 48 % (ref 39.0–52.0)
Hemoglobin: 16.7 g/dL (ref 13.0–17.0)
Hemoglobin: 16.3 g/dL (ref 13.0–17.0)
O2 Saturation: 91 %
O2 Saturation: 99 %
Patient temperature: 37.7
Patient temperature: 99.5
Potassium: 4.9 mmol/L (ref 3.5–5.1)
Potassium: 4.8 mmol/L (ref 3.5–5.1)
Sodium: 133 mmol/L — ABNORMAL LOW (ref 135–145)
Sodium: 134 mmol/L — ABNORMAL LOW (ref 135–145)
TCO2: 47 mmol/L — ABNORMAL HIGH (ref 22–32)
TCO2: 49 mmol/L — ABNORMAL HIGH (ref 22–32)
pCO2 arterial: 67.3 mmHg (ref 32–48)
pCO2 arterial: 69.5 mmHg (ref 32–48)
pH, Arterial: 7.438 (ref 7.35–7.45)
pH, Arterial: 7.439 (ref 7.35–7.45)
pO2, Arterial: 63 mmHg — ABNORMAL LOW (ref 83–108)
pO2, Arterial: 154 mmHg — ABNORMAL HIGH (ref 83–108)

## 2024-08-18 LAB — CBC
HCT: 49.5 % (ref 39.0–52.0)
Hemoglobin: 15.8 g/dL (ref 13.0–17.0)
MCH: 30 pg (ref 26.0–34.0)
MCHC: 31.9 g/dL (ref 30.0–36.0)
MCV: 93.9 fL (ref 80.0–100.0)
Platelets: 182 K/uL (ref 150–400)
RBC: 5.27 MIL/uL (ref 4.22–5.81)
RDW: 14.8 % (ref 11.5–15.5)
WBC: 13.4 K/uL — ABNORMAL HIGH (ref 4.0–10.5)
nRBC: 0.6 % — ABNORMAL HIGH (ref 0.0–0.2)

## 2024-08-18 LAB — PHOSPHORUS
Phosphorus: 3.3 mg/dL (ref 2.5–4.6)
Phosphorus: 3.4 mg/dL (ref 2.5–4.6)

## 2024-08-18 LAB — GLUCOSE, CAPILLARY
Glucose-Capillary: 122 mg/dL — ABNORMAL HIGH (ref 70–99)
Glucose-Capillary: 123 mg/dL — ABNORMAL HIGH (ref 70–99)
Glucose-Capillary: 127 mg/dL — ABNORMAL HIGH (ref 70–99)
Glucose-Capillary: 141 mg/dL — ABNORMAL HIGH (ref 70–99)
Glucose-Capillary: 144 mg/dL — ABNORMAL HIGH (ref 70–99)
Glucose-Capillary: 147 mg/dL — ABNORMAL HIGH (ref 70–99)

## 2024-08-18 LAB — TRIGLYCERIDES: Triglycerides: 154 mg/dL — ABNORMAL HIGH

## 2024-08-18 LAB — BASIC METABOLIC PANEL WITH GFR
Anion gap: 12 (ref 5–15)
BUN: 26 mg/dL — ABNORMAL HIGH (ref 6–20)
CO2: 35 mmol/L — ABNORMAL HIGH (ref 22–32)
Calcium: 8.7 mg/dL — ABNORMAL LOW (ref 8.9–10.3)
Chloride: 87 mmol/L — ABNORMAL LOW (ref 98–111)
Creatinine, Ser: 1.12 mg/dL (ref 0.61–1.24)
GFR, Estimated: >60 mL/min
Glucose, Bld: 123 mg/dL — ABNORMAL HIGH (ref 70–99)
Potassium: 5.8 mmol/L — ABNORMAL HIGH (ref 3.5–5.1)
Sodium: 134 mmol/L — ABNORMAL LOW (ref 135–145)

## 2024-08-18 LAB — HEMOGLOBIN A1C
Hgb A1c MFr Bld: 6.7 % — ABNORMAL HIGH (ref 4.8–5.6)
Mean Plasma Glucose: 145.59 mg/dL

## 2024-08-18 LAB — MAGNESIUM: Magnesium: 2.7 mg/dL — ABNORMAL HIGH (ref 1.7–2.4)

## 2024-08-18 LAB — ECHOCARDIOGRAM LIMITED
Area-P 1/2: 3.39 cm2
Height: 66 in
S' Lateral: 2.8 cm
Weight: 3283.97 [oz_av]

## 2024-08-18 LAB — MRSA NEXT GEN BY PCR, NASAL: MRSA by PCR Next Gen: DETECTED — AB

## 2024-08-18 MED ORDER — SODIUM CHLORIDE 0.9 % IV SOLN
100.0000 mg | Freq: Two times a day (BID) | INTRAVENOUS | Status: AC
  Administered 2024-08-18 – 2024-08-21 (×8): 100 mg via INTRAVENOUS
  Filled 2024-08-18 (×8): qty 100

## 2024-08-18 MED ORDER — PREDNISONE 20 MG PO TABS
40.0000 mg | ORAL_TABLET | Freq: Every day | ORAL | Status: DC
  Administered 2024-08-19 – 2024-08-20 (×2): 40 mg
  Filled 2024-08-18 (×2): qty 2

## 2024-08-18 MED ORDER — CALCIUM GLUCONATE-NACL 2-0.675 GM/100ML-% IV SOLN
2.0000 g | Freq: Once | INTRAVENOUS | Status: AC
  Administered 2024-08-18: 2000 mg via INTRAVENOUS
  Filled 2024-08-18: qty 100

## 2024-08-18 MED ORDER — SODIUM ZIRCONIUM CYCLOSILICATE 10 G PO PACK
10.0000 g | PACK | Freq: Once | ORAL | Status: AC
  Administered 2024-08-18: 10 g
  Filled 2024-08-18: qty 1

## 2024-08-18 MED ORDER — OSELTAMIVIR PHOSPHATE 75 MG PO CAPS
75.0000 mg | ORAL_CAPSULE | Freq: Two times a day (BID) | ORAL | Status: DC
  Administered 2024-08-18 – 2024-08-20 (×4): 75 mg
  Filled 2024-08-18 (×5): qty 1

## 2024-08-18 MED ORDER — OSMOLITE 1.5 CAL PO LIQD
1000.0000 mL | ORAL | Status: DC
  Administered 2024-08-18 – 2024-08-19 (×2): 1000 mL

## 2024-08-18 MED ORDER — MUPIROCIN 2 % EX OINT
1.0000 | TOPICAL_OINTMENT | Freq: Two times a day (BID) | CUTANEOUS | Status: AC
  Administered 2024-08-18 – 2024-08-23 (×10): 1 via NASAL
  Filled 2024-08-18: qty 22

## 2024-08-18 MED ORDER — VANCOMYCIN HCL IN DEXTROSE 1-5 GM/200ML-% IV SOLN: 1000.0000 mg | Freq: Once | INTRAVENOUS | Status: DC

## 2024-08-18 MED ORDER — PROSOURCE TF20 ENFIT COMPATIBL EN LIQD
60.0000 mL | Freq: Every day | ENTERAL | Status: DC
  Administered 2024-08-18 – 2024-08-19 (×2): 60 mL
  Filled 2024-08-18 (×2): qty 60

## 2024-08-18 MED ORDER — PERFLUTREN LIPID MICROSPHERE
1.0000 mL | INTRAVENOUS | Status: AC | PRN
  Administered 2024-08-18: 3 mL via INTRAVENOUS

## 2024-08-18 NOTE — Plan of Care (Signed)
  Problem: Pain Managment: Goal: General experience of comfort will improve and/or be controlled Outcome: Progressing   Problem: Safety: Goal: Ability to remain free from injury will improve Outcome: Progressing

## 2024-08-18 NOTE — Progress Notes (Signed)
 Pt is currently intubated and unable to assess. Pt has no active health insurance on file and not able to consent to financial screening for medicaid.  TOC will continue to follow.

## 2024-08-18 NOTE — Progress Notes (Signed)
 Initial Nutrition Assessment  DOCUMENTATION CODES:  Not applicable  INTERVENTION:  Initiate tube feeding via OGT: Osmolite 1.5 at 55 ml/h (1320 ml per day) *start at 25ml and advance by 10ml q6h to goal rate Prosource TF20 60 ml once daily Provides 2060 kcal, 103 gm protein, 1006 ml free water daily  NUTRITION DIAGNOSIS:  Increased nutrient needs related to chronic illness, acute illness (flu, COPD) as evidenced by estimated needs.  GOAL:  Patient will meet greater than or equal to 90% of their needs  MONITOR:  Labs, Weight trends, Vent status, TF tolerance  REASON FOR ASSESSMENT:  Ventilator, Consult Enteral/tube feeding initiation and management  ASSESSMENT:  Pt admitted with respiratory distress, found to have Flu A. PMH significant for HTN and COPD. Recently admitted to [facility] 12/13-12/18 with COPD exacerbation.  Patient is currently intubated on ventilator support MV: 9 L/min Temp (24hrs), Avg:99.4 F (37.4 C), Min:97.7 F (36.5 C), Max:99.9 F (37.7 C)  Pt unable to provide nutrition related history at this time. No acute events noted.  Daily SAT/SBT as tolerated.    OGT (side port within the stomach) Plan to initiate enteral nutrition support while on the vent.   There is limited weight documentation on file within the last year. From 08/13/23-08/18/24 it appears pt's weight has declined 12.7% which is not clinically significant for time frame.   Medications: colace BID, SSI 0-15 units q4h, protonix , miralax  daily, prednisone  daily Drips: IV abx Propofol  @ 22.22ml/hr  Labs:  Sodium 134 Chloride 87 BUN 26 Ionized calcium  1.06 Mg 2.7 Triglycerides 154 CBG's 123-193 x12 hours   NUTRITION - FOCUSED PHYSICAL EXAM:  Flowsheet Row Most Recent Value  Orbital Region No depletion  Upper Arm Region No depletion  Thoracic and Lumbar Region No depletion  Buccal Region Unable to assess  Temple Region No depletion  Clavicle Bone Region No depletion   Clavicle and Acromion Bone Region No depletion  Scapular Bone Region No depletion  Dorsal Hand No depletion  Patellar Region No depletion  Anterior Thigh Region Mild depletion  Posterior Calf Region No depletion  Edema (RD Assessment) None  Hair Reviewed  Eyes Unable to assess  Mouth Unable to assess  Skin Reviewed  Nails Reviewed    Diet Order:   Diet Order             Diet NPO time specified  Diet effective now                   EDUCATION NEEDS:   No education needs have been identified at this time  Skin:  Skin Assessment: Reviewed RN Assessment  Last BM:  unknown/PTA  Height:   Ht Readings from Last 1 Encounters:  08/17/24 5' 6 (1.676 m)    Weight:   Wt Readings from Last 1 Encounters:  08/18/24 93.1 kg   BMI:  Body mass index is 33.13 kg/m.  Estimated Nutritional Needs:   Kcal:  1900-2100  Protein:  95-110g  Fluid:  >/=1.9L  Benjamin Casey, RDN, LDN Clinical Nutrition See AMiON for contact information.

## 2024-08-18 NOTE — Progress Notes (Signed)
 "  NAME:  Benjamin Casey, MRN:  [id number], DOB:  1970-11-10, LOS: 1 ADMISSION DATE:  08/17/2024, CONSULTATION DATE:  08/17/2024 REFERRING MD:  Dr. Yolande, CHIEF COMPLAINT:  respiratory distress   History of Present Illness:  Patient intubated and sedated all information obtained via chart review and family at bedside  Benjamin Casey is a 53 year old male with history of COPD, HTN, former tobacco abuse who presented to ED by EMS with respiratory distress. On arrival EMS found patient's SpO2 to be 41% on room air. Per patient's aunt his roommate called her earlier today saying that he needed to go to the hospital. When she arrived at the house she found that he was blue in the face and lips, breathing heavily and murmuring incomprehensible sounds only. She does not know if he had any upper respiratory infections prior to becoming acutely ill. He is a former heavy smoker who quit about 15 years ago, former drug use and no alcohol use. He does not wear home oxygen.    He was recently admitted for [facility] from 12/13-12/18 for COPD exacerbation. At that time he was unable to get home O2 given lack of insurance. He was also prescribed steroid taper, duonebs and symbicort. He was referred to a pulmonologist but his aunt does not think he ever went. She also does not think he was compliant with his medications after discharge.   Pertinent  Medical History  COPD HTN Former tobacco abuse   Significant Hospital Events: Including procedures, antibiotic start and stop dates in addition to other pertinent events   08/17/2024: admitted for respiratory distress, COPD exacerbation, Flu A+. Intubated in the ED   Interim History / Subjective:   Remains intubated and sedated No acute events overnight  Objective    Blood pressure 95/74, pulse 87, temperature 99.5 F (37.5 C), resp. rate 20, height 5' 6 (1.676 m), weight 93.1 kg, SpO2 96%.    Vent Mode: PRVC FiO2 (%):  [50 %-100 %] 80 % Set Rate:  [20  bmp] 20 bmp Vt Set:  [510 mL] 510 mL PEEP:  [8 cmH20-88 cmH20] 8 cmH20 Plateau Pressure:  [18 cmH20] 18 cmH20   Intake/Output Summary (Last 24 hours) at 08/18/2024 9247 Last data filed at 08/18/2024 0701 Gross per 24 hour  Intake 884.09 ml  Output 1710 ml  Net -825.91 ml   Filed Weights   08/17/24 1645 08/17/24 2100 08/18/24 0500  Weight: 106 kg 93.1 kg 93.1 kg    Examination: General: acute on chronically ill appearing male, intubated HENT: [place]/AT, ET tube in place, moist mucous membranes Lungs: on mechanical ventilation, diminished, no wheezing Cardiovascular: regular rate and rhythm  Abdomen: soft, non-tender, non-distended  Extremities: warm, dry, no edema  Neuro: intubated and sedated, PERRL, moves all extremities spontaneously, +cough/gag GU: deferred  Labs/imaging reviewed   Resolved problem list   Assessment and Plan   Acute on chronic respiratory failure with hypoxia and hypercarbia, multifactorial due to below COPD exacerbation  Influenza A infection - cont full LTVV support, 4-8cc/kg IBW with goal Pplat <30 and DP<15  - VAP prevention protocol/ PPI - intermittent CXR/ ABG - wean FiO2 as able for SpO2>88% - daily SAT & SBT when appropriate  - aggressive pulmonary hygiene - start triple therapy with LAMA/LABA/ICS, PRN Duo-nebs - solumedrol 60 mg daily - f/u respiratory culture  - continue doxycycline and ceftriaxone  - tamiflu   Encephalopathy, likely due to hypercarbia and hypoxia - SAT when able  - continue PAD  protocol with propofol  and PRN fentanyl  for now  Elevated BNP Euvolemic on exam.  - Obtain Echo   Elevated troponin, likely due to demand ischemia ECG (personally read) NSR with T wave inversion in aVL, aVR and V1. Likely demand ischemia.  - Trend troponin   History of HTN - resume PTA hydralazine , holding PTA lisinopril  given hyperkalemia   Polycythemia, likely due to chronic hypoxemia - Continue to monitor daily CBC  Nutrition -  start TF  Labs   CBC: Recent Labs  Lab 08/17/24 1415 08/17/24 1426 08/17/24 1620 08/17/24 1632 08/17/24 1805 08/18/24 0445  WBC 12.6*  --   --   --  9.8 13.4*  NEUTROABS 9.8*  --   --   --   --   --   HGB 17.2* 19.0* 18.4* 18.7* 16.1 15.8  16.7  HCT 56.1* 56.0* 54.0* 55.0* 52.2* 49.5  49.0  MCV 98.6  --   --   --  97.2 93.9  PLT 181  --   --   --  169 182    Basic Metabolic Panel: Recent Labs  Lab 08/17/24 1415 08/17/24 1426 08/17/24 1620 08/17/24 1632 08/17/24 1805 08/18/24 0445  NA 130* 131* 129* 131*  --  134*  133*  K 5.6* 5.3* 5.4* 4.5  --  5.8*  4.9  CL 87* 88*  --   --   --  87*  CO2 34*  --   --   --   --  35*  GLUCOSE 123* 126*  --   --   --  123*  BUN 30* 37*  --   --   --  26*  CREATININE 1.09 1.20  --   --  1.09 1.12  CALCIUM 9.1  --   --   --   --  8.7*  MG  --   --   --   --   --  2.7*  PHOS  --   --   --   --   --  3.3   GFR: Estimated Creatinine Clearance: 80.5 mL/min (by C-G formula based on SCr of 1.12 mg/dL). Recent Labs  Lab 08/17/24 1415 08/17/24 1426 08/17/24 1622 08/17/24 1805 08/18/24 0445  PROCALCITON  --   --   --  0.11  --   WBC 12.6*  --   --  9.8 13.4*  LATICACIDVEN  --  1.5 1.9  --   --     Liver Function Tests: Recent Labs  Lab 08/17/24 1415  AST 66*  ALT 46*  ALKPHOS 84  BILITOT 0.5  PROT 7.7  ALBUMIN 4.1   No results for input(s): LIPASE, AMYLASE in the last 168 hours. No results for input(s): AMMONIA in the last 168 hours.  ABG    Component Value Date/Time   PHART 7.438 08/18/2024 0445   PCO2ART 67.3 (HH) 08/18/2024 0445   PO2ART 63 (L) 08/18/2024 0445   HCO3 45.2 (H) 08/18/2024 0445   TCO2 47 (H) 08/18/2024 0445   O2SAT 91 08/18/2024 0445     Coagulation Profile: No results for input(s): INR, PROTIME in the last 168 hours.  Cardiac Enzymes: No results for input(s): CKTOTAL, CKMB, CKMBINDEX, TROPONINI in the last 168 hours.  HbA1C: HB A1C (BAYER DCA - WAIVED)  Date/Time Value  Ref Range Status  09/24/2023 09:31 AM 6.5 (H) 4.8 - 5.6 % Final    Comment:             Prediabetes: 5.7 - 6.4  Diabetes: >6.4          Glycemic control for adults with diabetes: <7.0    Hgb A1c MFr Bld  Date/Time Value Ref Range Status  08/17/2024 09:44 PM 6.7 (H) 4.8 - 5.6 % Final    Comment:    (NOTE) Diagnosis of Diabetes The following HbA1c ranges recommended by the American Diabetes Association (ADA) may be used as an aid in the diagnosis of diabetes mellitus.  Hemoglobin             Suggested A1C NGSP%              Diagnosis  <5.7                   Non Diabetic  5.7-6.4                Pre-Diabetic  >6.4                   Diabetic  <7.0                   Glycemic control for                       adults with diabetes.      CBG: Recent Labs  Lab 08/17/24 2102 08/17/24 2338 08/18/24 0344  GLUCAP 193* 170* 147*       Critical care time: 32 minutes    The patient is critically ill with multiple organ system failure and requires high complexity decision making for assessment and support, frequent evaluation and titration of therapies, advanced monitoring, review of radiographic studies and interpretation of complex data.   Critical Care Time devoted to patient care services, exclusive of separately billable procedures, described in this note is  minutes.  Dorn Chill, MD [facility] Office: [phone]   See Amion for personal pager PCCM on call pager [phone] until 7pm. Please call Elink 7p-7a. [phone]         "

## 2024-08-19 ENCOUNTER — Inpatient Hospital Stay (HOSPITAL_COMMUNITY): Payer: Self-pay

## 2024-08-19 DIAGNOSIS — J101 Influenza due to other identified influenza virus with other respiratory manifestations: Secondary | ICD-10-CM | POA: Diagnosis not present

## 2024-08-19 DIAGNOSIS — J9621 Acute and chronic respiratory failure with hypoxia: Secondary | ICD-10-CM | POA: Diagnosis not present

## 2024-08-19 DIAGNOSIS — J441 Chronic obstructive pulmonary disease with (acute) exacerbation: Secondary | ICD-10-CM | POA: Diagnosis not present

## 2024-08-19 DIAGNOSIS — J9622 Acute and chronic respiratory failure with hypercapnia: Secondary | ICD-10-CM | POA: Diagnosis not present

## 2024-08-19 LAB — BASIC METABOLIC PANEL WITH GFR
Anion gap: 5 (ref 5–15)
BUN: 28 mg/dL — ABNORMAL HIGH (ref 6–20)
CO2: 42 mmol/L — ABNORMAL HIGH (ref 22–32)
Calcium: 8.5 mg/dL — ABNORMAL LOW (ref 8.9–10.3)
Chloride: 92 mmol/L — ABNORMAL LOW (ref 98–111)
Creatinine, Ser: 0.87 mg/dL (ref 0.61–1.24)
GFR, Estimated: >60 mL/min
Glucose, Bld: 117 mg/dL — ABNORMAL HIGH (ref 70–99)
Potassium: 4.7 mmol/L (ref 3.5–5.1)
Sodium: 138 mmol/L (ref 135–145)

## 2024-08-19 LAB — GLUCOSE, CAPILLARY
Glucose-Capillary: 122 mg/dL — ABNORMAL HIGH (ref 70–99)
Glucose-Capillary: 109 mg/dL — ABNORMAL HIGH (ref 70–99)
Glucose-Capillary: 141 mg/dL — ABNORMAL HIGH (ref 70–99)
Glucose-Capillary: 153 mg/dL — ABNORMAL HIGH (ref 70–99)
Glucose-Capillary: 140 mg/dL — ABNORMAL HIGH (ref 70–99)
Glucose-Capillary: 106 mg/dL — ABNORMAL HIGH (ref 70–99)

## 2024-08-19 LAB — PHOSPHORUS: Phosphorus: 3.1 mg/dL (ref 2.5–4.6)

## 2024-08-19 MED ORDER — POLYETHYLENE GLYCOL 3350 17 G PO PACK
17.0000 g | PACK | Freq: Two times a day (BID) | ORAL | Status: DC
  Administered 2024-08-19: 17 g
  Filled 2024-08-19: qty 1

## 2024-08-19 NOTE — Progress Notes (Signed)
 "  NAME:  Benjamin Casey, MRN:  [id number], DOB:  March 31, 1971, LOS: 2 ADMISSION DATE:  08/17/2024, CONSULTATION DATE:  08/17/2024 REFERRING MD:  Dr. Yolande, CHIEF COMPLAINT:  respiratory distress   History of Present Illness:  Patient intubated and sedated all information obtained via chart review and family at bedside  Benjamin Casey is a 53 year old male with history of COPD, HTN, former tobacco abuse who presented to ED by EMS with respiratory distress. On arrival EMS found patient's SpO2 to be 41% on room air. Per patient's aunt his roommate called her earlier today saying that he needed to go to the hospital. When she arrived at the house she found that he was blue in the face and lips, breathing heavily and murmuring incomprehensible sounds only. She does not know if he had any upper respiratory infections prior to becoming acutely ill. He is a former heavy smoker who quit about 15 years ago, former drug use and no alcohol use. He does not wear home oxygen.    He was recently admitted for [facility] from 12/13-12/18 for COPD exacerbation. At that time he was unable to get home O2 given lack of insurance. He was also prescribed steroid taper, duonebs and symbicort. He was referred to a pulmonologist but his aunt does not think he ever went. She also does not think he was compliant with his medications after discharge.   Pertinent  Medical History  COPD HTN Former tobacco abuse   Significant Hospital Events: Including procedures, antibiotic start and stop dates in addition to other pertinent events   08/17/2024: admitted for respiratory distress, COPD exacerbation, Flu A+. Intubated in the ED   Interim History / Subjective:  No acute event overnight Follows commands, on the vent  Objective    Blood pressure 95/70, pulse 71, temperature 99 F (37.2 C), resp. rate 20, height 5' 6 (1.676 m), weight 90.5 kg, SpO2 92%.    Vent Mode: PRVC FiO2 (%):  [55 %-70 %] 55 % Set Rate:  [16 bmp-18  bmp] 16 bmp Vt Set:  [510 mL] 510 mL PEEP:  [8 cmH20] 8 cmH20 Plateau Pressure:  [15 cmH20-23 cmH20] 15 cmH20   Intake/Output Summary (Last 24 hours) at 08/19/2024 0731 Last data filed at 08/19/2024 0600 Gross per 24 hour  Intake 1844.76 ml  Output 1946 ml  Net -101.24 ml   Filed Weights   08/17/24 2100 08/18/24 0500 08/19/24 0352  Weight: 93.1 kg 93.1 kg 90.5 kg    Examination: General: Chronically ill male, intubated HEENT: NCAT, moist mucous membrane, ET tube in place Lungs: Symmetric chest wall movement, clear to auscultation CVS: S1-S2 normal Abdomen: Soft nontender nondistended Extremities: Warm, no edema Neuro: PERL, follows commands, moves all extremities GU: Deferred  Resolved problem list   Assessment and Plan  53 year old male with history of COPD, HTN, presents with acute on chronic respiratory failure due to acute COPD exacerbation, and influenza  Acute on chronic respiratory failure with hypoxia and hypercarbia, multifactorial due to below COPD exacerbation  Influenza A infection - cont full LTVV support, 4-8cc/kg IBW with goal Pplat <30 and DP<15  - VAP prevention protocol/ PPI - intermittent CXR/ ABG - wean FiO2 as able for SpO2>88% - daily SAT & SBT when appropriate  - aggressive pulmonary hygiene - start triple therapy with LAMA/LABA/ICS, PRN Duo-nebs - solumedrol 60 mg daily - f/u respiratory culture  - continue doxycycline  and ceftriaxone  - tamiflu    - Came down to 40% FiO2 and 5  of PEEP - Will continue weaning off the vent as tolerated to maintain sats> 88%    Encephalopathy-improving likely due to hypercarbia and hypoxia - SAT when able  - continue PAD protocol with propofol  and PRN fentanyl  for now  Elevated BNP Euvolemic on exam.  - Obtain Echo   Elevated troponin, likely due to demand ischemia ECG (personally read) NSR with T wave inversion in aVL, aVR and V1. Likely demand ischemia.  - Trend troponin   History of HTN - resume  PTA hydralazine , holding PTA lisinopril  given hyperkalemia   Polycythemia, likely due to chronic hypoxemia - Continue to monitor daily CBC  Nutrition - start TF    Labs   CBC: Recent Labs  Lab 08/17/24 1415 08/17/24 1426 08/17/24 1620 08/17/24 1632 08/17/24 1805 08/18/24 0445 08/18/24 0840  WBC 12.6*  --   --   --  9.8 13.4*  --   NEUTROABS 9.8*  --   --   --   --   --   --   HGB 17.2*   < > 18.4* 18.7* 16.1 15.8  16.7 16.3  HCT 56.1*   < > 54.0* 55.0* 52.2* 49.5  49.0 48.0  MCV 98.6  --   --   --  97.2 93.9  --   PLT 181  --   --   --  169 182  --    < > = values in this interval not displayed.    Basic Metabolic Panel: Recent Labs  Lab 08/17/24 1415 08/17/24 1426 08/17/24 1620 08/17/24 1632 08/17/24 1805 08/18/24 0445 08/18/24 0840 08/18/24 1635 08/19/24 0236  NA 130* 131* 129* 131*  --  134*  133* 134*  --  138  K 5.6* 5.3* 5.4* 4.5  --  5.8*  4.9 4.8  --  4.7  CL 87* 88*  --   --   --  87*  --   --  92*  CO2 34*  --   --   --   --  35*  --   --  42*  GLUCOSE 123* 126*  --   --   --  123*  --   --  117*  BUN 30* 37*  --   --   --  26*  --   --  28*  CREATININE 1.09 1.20  --   --  1.09 1.12  --   --  0.87  CALCIUM  9.1  --   --   --   --  8.7*  --   --  8.5*  MG  --   --   --   --   --  2.7*  --   --   --   PHOS  --   --   --   --   --  3.3  --  3.4 3.1   GFR: Estimated Creatinine Clearance: 102.3 mL/min (by C-G formula based on SCr of 0.87 mg/dL). Recent Labs  Lab 08/17/24 1415 08/17/24 1426 08/17/24 1622 08/17/24 1805 08/18/24 0445  PROCALCITON  --   --   --  0.11  --   WBC 12.6*  --   --  9.8 13.4*  LATICACIDVEN  --  1.5 1.9  --   --     Liver Function Tests: Recent Labs  Lab 08/17/24 1415  AST 66*  ALT 46*  ALKPHOS 84  BILITOT 0.5  PROT 7.7  ALBUMIN 4.1   No results for input(s): LIPASE, AMYLASE in the  last 168 hours. No results for input(s): AMMONIA in the last 168 hours.  ABG    Component Value Date/Time   PHART  7.439 08/18/2024 0840   PCO2ART 69.5 (HH) 08/18/2024 0840   PO2ART 154 (H) 08/18/2024 0840   HCO3 46.9 (H) 08/18/2024 0840   TCO2 49 (H) 08/18/2024 0840   O2SAT 99 08/18/2024 0840     Coagulation Profile: No results for input(s): INR, PROTIME in the last 168 hours.  Cardiac Enzymes: No results for input(s): CKTOTAL, CKMB, CKMBINDEX, TROPONINI in the last 168 hours.  HbA1C: HB A1C (BAYER DCA - WAIVED)  Date/Time Value Ref Range Status  09/24/2023 09:31 AM 6.5 (H) 4.8 - 5.6 % Final    Comment:             Prediabetes: 5.7 - 6.4          Diabetes: >6.4          Glycemic control for adults with diabetes: <7.0    Hgb A1c MFr Bld  Date/Time Value Ref Range Status  08/17/2024 09:44 PM 6.7 (H) 4.8 - 5.6 % Final    Comment:    (NOTE) Diagnosis of Diabetes The following HbA1c ranges recommended by the American Diabetes Association (ADA) may be used as an aid in the diagnosis of diabetes mellitus.  Hemoglobin             Suggested A1C NGSP%              Diagnosis  <5.7                   Non Diabetic  5.7-6.4                Pre-Diabetic  >6.4                   Diabetic  <7.0                   Glycemic control for                       adults with diabetes.      CBG: Recent Labs  Lab 08/18/24 1153 08/18/24 1538 08/18/24 1931 08/18/24 2334 08/19/24 0344  GLUCAP 122* 127* 141* 144* 122*       Critical care time: 35 minutes      Lenny Drought, MD  [facility] Prefer epic messenger for cross cover needs    Critical care time was exclusive of separately billable procedures and treating other patients.  Critical care was necessary to treat or prevent imminent or life-threatening deterioration.  Critical care was time spent personally by me on the following activities: development of treatment plan with patient and/or surrogate as well as nursing, discussions with consultants, evaluation of patient's response to treatment,  examination of patient, obtaining history from patient or surrogate, ordering and performing treatments and interventions, ordering and review of laboratory studies, ordering and review of radiographic studies, pulse oximetry, re-evaluation of patient's condition and participation in multidisciplinary rounds.    "

## 2024-08-20 DIAGNOSIS — J441 Chronic obstructive pulmonary disease with (acute) exacerbation: Secondary | ICD-10-CM | POA: Diagnosis not present

## 2024-08-20 DIAGNOSIS — J9621 Acute and chronic respiratory failure with hypoxia: Secondary | ICD-10-CM | POA: Diagnosis not present

## 2024-08-20 DIAGNOSIS — J9622 Acute and chronic respiratory failure with hypercapnia: Secondary | ICD-10-CM | POA: Diagnosis not present

## 2024-08-20 DIAGNOSIS — J101 Influenza due to other identified influenza virus with other respiratory manifestations: Secondary | ICD-10-CM | POA: Diagnosis not present

## 2024-08-20 LAB — CBC
HCT: 43.4 % (ref 39.0–52.0)
Hemoglobin: 13.5 g/dL (ref 13.0–17.0)
MCH: 29.9 pg (ref 26.0–34.0)
MCHC: 31.1 g/dL (ref 30.0–36.0)
MCV: 96 fL (ref 80.0–100.0)
Platelets: 201 K/uL (ref 150–400)
RBC: 4.52 MIL/uL (ref 4.22–5.81)
RDW: 15.4 % (ref 11.5–15.5)
WBC: 8.4 K/uL (ref 4.0–10.5)
nRBC: 0.4 % — ABNORMAL HIGH (ref 0.0–0.2)

## 2024-08-20 LAB — GLUCOSE, CAPILLARY
Glucose-Capillary: 144 mg/dL — ABNORMAL HIGH (ref 70–99)
Glucose-Capillary: 137 mg/dL — ABNORMAL HIGH (ref 70–99)
Glucose-Capillary: 105 mg/dL — ABNORMAL HIGH (ref 70–99)
Glucose-Capillary: 155 mg/dL — ABNORMAL HIGH (ref 70–99)
Glucose-Capillary: 119 mg/dL — ABNORMAL HIGH (ref 70–99)

## 2024-08-20 LAB — BASIC METABOLIC PANEL WITH GFR
Anion gap: 5 (ref 5–15)
BUN: 24 mg/dL — ABNORMAL HIGH (ref 6–20)
CO2: 40 mmol/L — ABNORMAL HIGH (ref 22–32)
Calcium: 8.2 mg/dL — ABNORMAL LOW (ref 8.9–10.3)
Chloride: 96 mmol/L — ABNORMAL LOW (ref 98–111)
Creatinine, Ser: 0.78 mg/dL (ref 0.61–1.24)
GFR, Estimated: >60 mL/min
Glucose, Bld: 136 mg/dL — ABNORMAL HIGH (ref 70–99)
Potassium: 4.6 mmol/L (ref 3.5–5.1)
Sodium: 141 mmol/L (ref 135–145)

## 2024-08-20 LAB — PHOSPHORUS: Phosphorus: 3.5 mg/dL (ref 2.5–4.6)

## 2024-08-20 LAB — MAGNESIUM: Magnesium: 2.4 mg/dL (ref 1.7–2.4)

## 2024-08-20 MED ORDER — POLYETHYLENE GLYCOL 3350 17 G PO PACK: 17.0000 g | PACK | Freq: Every day | ORAL | Status: DC | PRN

## 2024-08-20 MED ORDER — OSELTAMIVIR PHOSPHATE 75 MG PO CAPS
75.0000 mg | ORAL_CAPSULE | Freq: Two times a day (BID) | ORAL | Status: AC
  Administered 2024-08-20 – 2024-08-22 (×5): 75 mg via ORAL
  Filled 2024-08-20 (×5): qty 1

## 2024-08-20 MED ORDER — FUROSEMIDE 10 MG/ML IJ SOLN
40.0000 mg | Freq: Once | INTRAMUSCULAR | Status: AC
  Administered 2024-08-20: 40 mg via INTRAVENOUS
  Filled 2024-08-20: qty 4

## 2024-08-20 MED ORDER — ORAL CARE MOUTH RINSE: 15.0000 mL | OROMUCOSAL | Status: DC | PRN

## 2024-08-20 MED ORDER — PREDNISONE 20 MG PO TABS
40.0000 mg | ORAL_TABLET | Freq: Every day | ORAL | Status: AC
  Administered 2024-08-21: 40 mg via ORAL
  Filled 2024-08-20: qty 2

## 2024-08-20 NOTE — Progress Notes (Signed)
 eLink Physician-Brief Progress Note Patient Name: Benjamin Casey DOB: [birth year] MRN: [id number]   Date of Service  08/20/2024  HPI/Events of Note  Extubated earlier today, requesting for CBGs to be changed to AC/at bedtime.  All CBGs have been <180 without evidence of steroid-induced hyperglycemia or hypoglycemic events.  eICU Interventions  DC CBG testing/SSI      Intervention Category Minor Interventions: Routine modifications to care plan (e.g. PRN medications for pain, fever)  Tanee Henery 08/20/2024, 11:02 PM

## 2024-08-20 NOTE — Progress Notes (Signed)
 "  NAME:  Benjamin Casey, MRN:  [id number], DOB:  Mar 22, 1971, LOS: 3 ADMISSION DATE:  08/17/2024, CONSULTATION DATE:  08/17/2024 REFERRING MD:  Dr. Yolande, CHIEF COMPLAINT:  respiratory distress   History of Present Illness:  Patient intubated and sedated all information obtained via chart review and family at bedside  Benjamin Casey is a 53 year old male with history of COPD, HTN, former tobacco abuse who presented to ED by EMS with respiratory distress. On arrival EMS found patient's SpO2 to be 41% on room air. Per patient's aunt his roommate called her earlier today saying that he needed to go to the hospital. When she arrived at the house she found that he was blue in the face and lips, breathing heavily and murmuring incomprehensible sounds only. She does not know if he had any upper respiratory infections prior to becoming acutely ill. He is a former heavy smoker who quit about 15 years ago, former drug use and no alcohol use. He does not wear home oxygen.    He was recently admitted for [facility] from 12/13-12/18 for COPD exacerbation. At that time he was unable to get home O2 given lack of insurance. He was also prescribed steroid taper, duonebs and symbicort. He was referred to a pulmonologist but his aunt does not think he ever went. She also does not think he was compliant with his medications after discharge.   Pertinent  Medical History  COPD HTN Former tobacco abuse   Significant Hospital Events: Including procedures, antibiotic start and stop dates in addition to other pertinent events   08/17/2024: admitted for respiratory distress, COPD exacerbation, Flu A+. Intubated in the ED   Interim History / Subjective:  No acute events overnight Extubated this a.m., requires heated high flow at 60 L 90%, will use BiPAP if needed Otherwise follows commands, tolerating diet Will keep him here today due to high oxygen requirement  Objective    Blood pressure 92/62, pulse 63,  temperature 98.8 F (37.1 C), resp. rate 17, height 5' 6 (1.676 m), weight 93.8 kg, SpO2 93%.    Vent Mode: PRVC FiO2 (%):  [40 %-55 %] 40 % Set Rate:  [16 bmp] 16 bmp Vt Set:  [510 mL] 510 mL PEEP:  [8 cmH20] 8 cmH20 Plateau Pressure:  [15 cmH20-20 cmH20] 20 cmH20   Intake/Output Summary (Last 24 hours) at 08/20/2024 0720 Last data filed at 08/20/2024 0600 Gross per 24 hour  Intake 2605.49 ml  Output 1500 ml  Net 1105.49 ml   Filed Weights   08/18/24 0500 08/19/24 0352 08/20/24 0500  Weight: 93.1 kg 90.5 kg 93.8 kg    Examination: General: Chronically ill appearing male, lying in bed HEENT: NCAT moist mucous membrane Lungs: Symmetrical chest wall movement, clear to auscultation CVS: S1-S2 normal Abdomen: Soft nontender nondistended Extremities: Warm and no edema Neuro: Follows commands, move all extremities GU: Deferred   Resolved problem list   Assessment and Plan  53 year old male with history of COPD, HTN, presents with acute on chronic respiratory failure due to acute COPD exacerbation, and influenza  No acute events overnight Extubated this a.m., requires heated high flow at 60 L 90%, will use BiPAP if needed Otherwise follows commands, tolerating diet Will keep him here today due to high oxygen requirement  Acute on chronic respiratory failure with hypoxia and hypercarbia-that required mechanical vent-improved multifactorial due to below COPD exacerbation  Influenza A infection - Extubated on 12/21.  Now requiring heated high flow at 60 L  90% Will use BiPAP if needed -Continue ceftriaxone  and Doxy - Continue Tamiflu  - Continue triple therapy LAMA/LABA/ICS, as needed DuoNebs - Continue prednisone  40 mg daily    Encephalopathy-improved likely due to hypercarbia and hypoxia - continue PAD protocol with propofol  and PRN fentanyl  for now  Elevated BNP Euvolemic on exam.  - Obtain Echo   Elevated troponin, likely due to demand ischemia ECG (personally  read) NSR with T wave inversion in aVL, aVR and V1. Likely demand ischemia.  - Trend troponin   History of HTN - resume PTA hydralazine , holding PTA lisinopril  given hyperkalemia   Polycythemia, likely due to chronic hypoxemia - Continue to monitor daily CBC  Nutrition - start TF    Labs   CBC: Recent Labs  Lab 08/17/24 1415 08/17/24 1426 08/17/24 1632 08/17/24 1805 08/18/24 0445 08/18/24 0840 08/20/24 0206  WBC 12.6*  --   --  9.8 13.4*  --  8.4  NEUTROABS 9.8*  --   --   --   --   --   --   HGB 17.2*   < > 18.7* 16.1 15.8  16.7 16.3 13.5  HCT 56.1*   < > 55.0* 52.2* 49.5  49.0 48.0 43.4  MCV 98.6  --   --  97.2 93.9  --  96.0  PLT 181  --   --  169 182  --  201   < > = values in this interval not displayed.    Basic Metabolic Panel: Recent Labs  Lab 08/17/24 1415 08/17/24 1426 08/17/24 1620 08/17/24 1632 08/17/24 1805 08/18/24 0445 08/18/24 0840 08/18/24 1635 08/19/24 0236 08/20/24 0206  NA 130* 131*   < > 131*  --  134*  133* 134*  --  138 141  K 5.6* 5.3*   < > 4.5  --  5.8*  4.9 4.8  --  4.7 4.6  CL 87* 88*  --   --   --  87*  --   --  92* 96*  CO2 34*  --   --   --   --  35*  --   --  42* 40*  GLUCOSE 123* 126*  --   --   --  123*  --   --  117* 136*  BUN 30* 37*  --   --   --  26*  --   --  28* 24*  CREATININE 1.09 1.20  --   --  1.09 1.12  --   --  0.87 0.78  CALCIUM  9.1  --   --   --   --  8.7*  --   --  8.5* 8.2*  MG  --   --   --   --   --  2.7*  --   --   --  2.4  PHOS  --   --   --   --   --  3.3  --  3.4 3.1 3.5   < > = values in this interval not displayed.   GFR: Estimated Creatinine Clearance: 113.2 mL/min (by C-G formula based on SCr of 0.78 mg/dL). Recent Labs  Lab 08/17/24 1415 08/17/24 1426 08/17/24 1622 08/17/24 1805 08/18/24 0445 08/20/24 0206  PROCALCITON  --   --   --  0.11  --   --   WBC 12.6*  --   --  9.8 13.4* 8.4  LATICACIDVEN  --  1.5 1.9  --   --   --  Liver Function Tests: Recent Labs  Lab  08/17/24 1415  AST 66*  ALT 46*  ALKPHOS 84  BILITOT 0.5  PROT 7.7  ALBUMIN 4.1   No results for input(s): LIPASE, AMYLASE in the last 168 hours. No results for input(s): AMMONIA in the last 168 hours.  ABG    Component Value Date/Time   PHART 7.439 08/18/2024 0840   PCO2ART 69.5 (HH) 08/18/2024 0840   PO2ART 154 (H) 08/18/2024 0840   HCO3 46.9 (H) 08/18/2024 0840   TCO2 49 (H) 08/18/2024 0840   O2SAT 99 08/18/2024 0840     Coagulation Profile: No results for input(s): INR, PROTIME in the last 168 hours.  Cardiac Enzymes: No results for input(s): CKTOTAL, CKMB, CKMBINDEX, TROPONINI in the last 168 hours.  HbA1C: HB A1C (BAYER DCA - WAIVED)  Date/Time Value Ref Range Status  09/24/2023 09:31 AM 6.5 (H) 4.8 - 5.6 % Final    Comment:             Prediabetes: 5.7 - 6.4          Diabetes: >6.4          Glycemic control for adults with diabetes: <7.0    Hgb A1c MFr Bld  Date/Time Value Ref Range Status  08/17/2024 09:44 PM 6.7 (H) 4.8 - 5.6 % Final    Comment:    (NOTE) Diagnosis of Diabetes The following HbA1c ranges recommended by the American Diabetes Association (ADA) may be used as an aid in the diagnosis of diabetes mellitus.  Hemoglobin             Suggested A1C NGSP%              Diagnosis  <5.7                   Non Diabetic  5.7-6.4                Pre-Diabetic  >6.4                   Diabetic  <7.0                   Glycemic control for                       adults with diabetes.      CBG: Recent Labs  Lab 08/19/24 1147 08/19/24 1606 08/19/24 1943 08/19/24 2329 08/20/24 0336  GLUCAP 141* 153* 140* 106* 144*       Critical care time: 35 minutes      Lenny Drought, MD  [facility] Prefer epic messenger for cross cover needs    Critical care time was exclusive of separately billable procedures and treating other patients.  Critical care was necessary to treat or prevent imminent or  life-threatening deterioration.  Critical care was time spent personally by me on the following activities: development of treatment plan with patient and/or surrogate as well as nursing, discussions with consultants, evaluation of patient's response to treatment, examination of patient, obtaining history from patient or surrogate, ordering and performing treatments and interventions, ordering and review of laboratory studies, ordering and review of radiographic studies, pulse oximetry, re-evaluation of patient's condition and participation in multidisciplinary rounds.    "

## 2024-08-21 ENCOUNTER — Inpatient Hospital Stay (HOSPITAL_COMMUNITY)

## 2024-08-21 DIAGNOSIS — J9622 Acute and chronic respiratory failure with hypercapnia: Secondary | ICD-10-CM | POA: Diagnosis not present

## 2024-08-21 DIAGNOSIS — J101 Influenza due to other identified influenza virus with other respiratory manifestations: Secondary | ICD-10-CM | POA: Diagnosis not present

## 2024-08-21 DIAGNOSIS — J441 Chronic obstructive pulmonary disease with (acute) exacerbation: Secondary | ICD-10-CM | POA: Diagnosis not present

## 2024-08-21 DIAGNOSIS — J9621 Acute and chronic respiratory failure with hypoxia: Secondary | ICD-10-CM | POA: Diagnosis not present

## 2024-08-21 LAB — BASIC METABOLIC PANEL WITH GFR
Anion gap: 7 (ref 5–15)
BUN: 20 mg/dL (ref 6–20)
CO2: 38 mmol/L — ABNORMAL HIGH (ref 22–32)
Calcium: 8.5 mg/dL — ABNORMAL LOW (ref 8.9–10.3)
Chloride: 96 mmol/L — ABNORMAL LOW (ref 98–111)
Creatinine, Ser: 0.71 mg/dL (ref 0.61–1.24)
GFR, Estimated: >60 mL/min
Glucose, Bld: 110 mg/dL — ABNORMAL HIGH (ref 70–99)
Potassium: 4.4 mmol/L (ref 3.5–5.1)
Sodium: 142 mmol/L (ref 135–145)

## 2024-08-21 LAB — GLUCOSE, CAPILLARY
Glucose-Capillary: 74 mg/dL (ref 70–99)
Glucose-Capillary: 146 mg/dL — ABNORMAL HIGH (ref 70–99)
Glucose-Capillary: 162 mg/dL — ABNORMAL HIGH (ref 70–99)

## 2024-08-21 MED ORDER — ACETAZOLAMIDE SODIUM 500 MG IJ SOLR
250.0000 mg | Freq: Once | INTRAMUSCULAR | Status: AC
  Administered 2024-08-21: 250 mg via INTRAVENOUS
  Filled 2024-08-21: qty 250

## 2024-08-21 MED ORDER — FUROSEMIDE 10 MG/ML IJ SOLN
20.0000 mg | Freq: Once | INTRAMUSCULAR | Status: AC
  Administered 2024-08-21: 20 mg via INTRAVENOUS
  Filled 2024-08-21: qty 2

## 2024-08-21 NOTE — Plan of Care (Signed)
  Problem: Safety: Goal: Non-violent Restraint(s) Outcome: Progressing   Problem: Education: Goal: Knowledge of General Education information will improve Description: Including pain rating scale, medication(s)/side effects and non-pharmacologic comfort measures Outcome: Progressing   Problem: Health Behavior/Discharge Planning: Goal: Ability to manage health-related needs will improve Outcome: Progressing   Problem: Clinical Measurements: Goal: Ability to maintain clinical measurements within normal limits will improve Outcome: Progressing Goal: Will remain free from infection Outcome: Progressing Goal: Diagnostic test results will improve Outcome: Progressing Goal: Respiratory complications will improve Outcome: Progressing Goal: Cardiovascular complication will be avoided Outcome: Progressing   Problem: Activity: Goal: Risk for activity intolerance will decrease Outcome: Progressing   Problem: Nutrition: Goal: Adequate nutrition will be maintained Outcome: Progressing   Problem: Coping: Goal: Level of anxiety will decrease Outcome: Progressing   Problem: Elimination: Goal: Will not experience complications related to bowel motility Outcome: Progressing Goal: Will not experience complications related to urinary retention Outcome: Progressing   Problem: Pain Managment: Goal: General experience of comfort will improve and/or be controlled Outcome: Progressing   Problem: Safety: Goal: Ability to remain free from injury will improve Outcome: Progressing   Problem: Skin Integrity: Goal: Risk for impaired skin integrity will decrease Outcome: Progressing   Problem: Education: Goal: Ability to describe self-care measures that may prevent or decrease complications (Diabetes Survival Skills Education) will improve Outcome: Progressing Goal: Individualized Educational Video(s) Outcome: Progressing   Problem: Coping: Goal: Ability to adjust to condition or change  in health will improve Outcome: Progressing   Problem: Fluid Volume: Goal: Ability to maintain a balanced intake and output will improve Outcome: Progressing   Problem: Health Behavior/Discharge Planning: Goal: Ability to identify and utilize available resources and services will improve Outcome: Progressing Goal: Ability to manage health-related needs will improve Outcome: Progressing   Problem: Metabolic: Goal: Ability to maintain appropriate glucose levels will improve Outcome: Progressing   Problem: Nutritional: Goal: Maintenance of adequate nutrition will improve Outcome: Progressing Goal: Progress toward achieving an optimal weight will improve Outcome: Progressing   Problem: Skin Integrity: Goal: Risk for impaired skin integrity will decrease Outcome: Progressing   Problem: Tissue Perfusion: Goal: Adequacy of tissue perfusion will improve Outcome: Progressing

## 2024-08-21 NOTE — Progress Notes (Addendum)
 "  NAME:  Benjamin Casey, MRN:  [id number], DOB:  10/15/1970, LOS: 4 ADMISSION DATE:  08/17/2024, CONSULTATION DATE:  08/17/2024 REFERRING MD:  Dr. Yolande, CHIEF COMPLAINT:  respiratory distress   History of Present Illness:  Patient intubated and sedated all information obtained via chart review and family at bedside  Mr. Burgard is a 53 year old male with history of COPD, HTN, former tobacco abuse who presented to ED by EMS with respiratory distress. On arrival EMS found patient's SpO2 to be 41% on room air. Per patient's aunt his roommate called her earlier today saying that he needed to go to the hospital. When she arrived at the house she found that he was blue in the face and lips, breathing heavily and murmuring incomprehensible sounds only. She does not know if he had any upper respiratory infections prior to becoming acutely ill. He is a former heavy smoker who quit about 15 years ago, former drug use and no alcohol use. He does not wear home oxygen.    He was recently admitted for [facility] from 12/13-12/18 for COPD exacerbation. At that time he was unable to get home O2 given lack of insurance. He was also prescribed steroid taper, duonebs and symbicort. He was referred to a pulmonologist but his aunt does not think he ever went. She also does not think he was compliant with his medications after discharge.   Pertinent  Medical History  COPD HTN Former tobacco abuse   Significant Hospital Events: Including procedures, antibiotic start and stop dates in addition to other pertinent events   08/17/2024: admitted for respiratory distress, COPD exacerbation, Flu A+. Intubated in the ED   Interim History / Subjective:  Still requiring heated high flow 40 L 50%, on breathing treatment, CXR improved but still congested, will try to give more diuretic challenge Following commands, tolerating diet  Objective    Blood pressure 117/83, pulse 63, temperature 98.8 F (37.1 C), resp. rate  17, height 5' 6 (1.676 m), weight 93.8 kg, SpO2 95%.    FiO2 (%):  [50 %-100 %] 65 %   Intake/Output Summary (Last 24 hours) at 08/21/2024 0743 Last data filed at 08/21/2024 9461 Gross per 24 hour  Intake 919.49 ml  Output 3620 ml  Net -2700.51 ml   Filed Weights   08/18/24 0500 08/19/24 0352 08/20/24 0500  Weight: 93.1 kg 90.5 kg 93.8 kg    Examination: General: Chronically ill-appearing male, lying in bed HEENT: NCAT, moist mucous membranes Lungs: Symmetrical chest wall movement, mild wheeze otherwise clear to auscultation CVS: S1-S2 normal, no murmur or regurg Abdomen: Soft nontender nondistended Extremities: Warm and no edema Neuro: Follow commands, moves all extremities GU: Deferred   Resolved problem list   Assessment and Plan  53 year old male with history of COPD, HTN, presents with acute on chronic respiratory failure due to acute COPD exacerbation, and influenza   Acute on chronic respiratory failure with hypoxia and hypercarbia-that required mechanical vent-improved multifactorial due to below COPD exacerbation  Influenza A infection - Extubated on 12/21. heated high flow at 60 L 90% Will use BiPAP if needed -Still requiring heated high flow 40 L 50% CXR improved but still congested Will give more diuretic challenge Due to contraction alkalosis, will give more diuretic challenge, 250 of Diamox  and 20 of Lasix  -Continue ceftriaxone  and Doxy - Continue Tamiflu  - Continue triple therapy LAMA/LABA/ICS, as needed DuoNebs - Continue prednisone  40 mg daily    Encephalopathy-improved likely due to hypercarbia and hypoxia -  continue PAD protocol with propofol  and PRN fentanyl  for now  Elevated BNP Euvolemic on exam.  - Obtain Echo   Elevated troponin, likely due to demand ischemia ECG (personally read) NSR with T wave inversion in aVL, aVR and V1. Likely demand ischemia.  - Trend troponin   History of HTN - resume PTA hydralazine , holding PTA  lisinopril  given hyperkalemia   Polycythemia, likely due to chronic hypoxemia - Continue to monitor daily CBC  Nutrition - start TF    Labs   CBC: Recent Labs  Lab 08/17/24 1415 08/17/24 1426 08/17/24 1632 08/17/24 1805 08/18/24 0445 08/18/24 0840 08/20/24 0206  WBC 12.6*  --   --  9.8 13.4*  --  8.4  NEUTROABS 9.8*  --   --   --   --   --   --   HGB 17.2*   < > 18.7* 16.1 15.8  16.7 16.3 13.5  HCT 56.1*   < > 55.0* 52.2* 49.5  49.0 48.0 43.4  MCV 98.6  --   --  97.2 93.9  --  96.0  PLT 181  --   --  169 182  --  201   < > = values in this interval not displayed.    Basic Metabolic Panel: Recent Labs  Lab 08/17/24 1415 08/17/24 1426 08/17/24 1620 08/17/24 1805 08/18/24 0445 08/18/24 0840 08/18/24 1635 08/19/24 0236 08/20/24 0206 08/21/24 0117  NA 130* 131*   < >  --  134*  133* 134*  --  138 141 142  K 5.6* 5.3*   < >  --  5.8*  4.9 4.8  --  4.7 4.6 4.4  CL 87* 88*  --   --  87*  --   --  92* 96* 96*  CO2 34*  --   --   --  35*  --   --  42* 40* 38*  GLUCOSE 123* 126*  --   --  123*  --   --  117* 136* 110*  BUN 30* 37*  --   --  26*  --   --  28* 24* 20  CREATININE 1.09 1.20  --  1.09 1.12  --   --  0.87 0.78 0.71  CALCIUM  9.1  --   --   --  8.7*  --   --  8.5* 8.2* 8.5*  MG  --   --   --   --  2.7*  --   --   --  2.4  --   PHOS  --   --   --   --  3.3  --  3.4 3.1 3.5  --    < > = values in this interval not displayed.   GFR: Estimated Creatinine Clearance: 113.2 mL/min (by C-G formula based on SCr of 0.71 mg/dL). Recent Labs  Lab 08/17/24 1415 08/17/24 1426 08/17/24 1622 08/17/24 1805 08/18/24 0445 08/20/24 0206  PROCALCITON  --   --   --  0.11  --   --   WBC 12.6*  --   --  9.8 13.4* 8.4  LATICACIDVEN  --  1.5 1.9  --   --   --     Liver Function Tests: Recent Labs  Lab 08/17/24 1415  AST 66*  ALT 46*  ALKPHOS 84  BILITOT 0.5  PROT 7.7  ALBUMIN 4.1   No results for input(s): LIPASE, AMYLASE in the last 168 hours. No results  for input(s): AMMONIA in the  last 168 hours.  ABG    Component Value Date/Time   PHART 7.439 08/18/2024 0840   PCO2ART 69.5 (HH) 08/18/2024 0840   PO2ART 154 (H) 08/18/2024 0840   HCO3 46.9 (H) 08/18/2024 0840   TCO2 49 (H) 08/18/2024 0840   O2SAT 99 08/18/2024 0840     Coagulation Profile: No results for input(s): INR, PROTIME in the last 168 hours.  Cardiac Enzymes: No results for input(s): CKTOTAL, CKMB, CKMBINDEX, TROPONINI in the last 168 hours.  HbA1C: HB A1C (BAYER DCA - WAIVED)  Date/Time Value Ref Range Status  09/24/2023 09:31 AM 6.5 (H) 4.8 - 5.6 % Final    Comment:             Prediabetes: 5.7 - 6.4          Diabetes: >6.4          Glycemic control for adults with diabetes: <7.0    Hgb A1c MFr Bld  Date/Time Value Ref Range Status  08/17/2024 09:44 PM 6.7 (H) 4.8 - 5.6 % Final    Comment:    (NOTE) Diagnosis of Diabetes The following HbA1c ranges recommended by the American Diabetes Association (ADA) may be used as an aid in the diagnosis of diabetes mellitus.  Hemoglobin             Suggested A1C NGSP%              Diagnosis  <5.7                   Non Diabetic  5.7-6.4                Pre-Diabetic  >6.4                   Diabetic  <7.0                   Glycemic control for                       adults with diabetes.      CBG: Recent Labs  Lab 08/20/24 0726 08/20/24 1106 08/20/24 1526 08/20/24 1913 08/21/24 0734  GLUCAP 137* 105* 155* 119* 74       Critical care time: 30 minutes      Lenny Drought, MD  [facility] Prefer epic messenger for cross cover needs    Critical care time was exclusive of separately billable procedures and treating other patients.  Critical care was necessary to treat or prevent imminent or life-threatening deterioration.  Critical care was time spent personally by me on the following activities: development of treatment plan with patient and/or surrogate as  well as nursing, discussions with consultants, evaluation of patient's response to treatment, examination of patient, obtaining history from patient or surrogate, ordering and performing treatments and interventions, ordering and review of laboratory studies, ordering and review of radiographic studies, pulse oximetry, re-evaluation of patient's condition and participation in multidisciplinary rounds.    "

## 2024-08-21 NOTE — TOC Progression Note (Addendum)
 Transition of Care [facility] - Progression Note    Patient Details  Name: CARVIN ALMAS MRN: [id number] Date of Birth: 1970/09/29  Transition of Care [facility]) CM/SW Contact  Lendia Dais, [place] Phone Number: 08/21/2024, 3:42 PM  Clinical Narrative: Pt is not yet medically stable. Pt has been extubated but is on 40 O2 high flow.    CSW received TOC consult for the pt needing assistance with disability/food stamps application.CSW informed the financial navigator for assistance with disability and left the hard copy food stamp application at bedside.  CSW will continue to follow.                       Expected Discharge Plan and Services                                               Social Drivers of Health (SDOH) Interventions SDOH Screenings   Food Insecurity: Patient Unable To Answer (08/17/2024)  Housing: Patient Unable To Answer (08/17/2024)  Transportation Needs: Patient Unable To Answer (08/17/2024)  Utilities: Patient Unable To Answer (08/17/2024)  Depression (PHQ2-9): Low Risk (09/24/2023)  Stress: No Stress Concern Present (08/09/2024)   Received from [facility]  Tobacco Use: Medium Risk (08/11/2024)   Received from [facility]    Readmission Risk Interventions     No data to display

## 2024-08-22 ENCOUNTER — Inpatient Hospital Stay (HOSPITAL_COMMUNITY)

## 2024-08-22 DIAGNOSIS — J101 Influenza due to other identified influenza virus with other respiratory manifestations: Secondary | ICD-10-CM | POA: Diagnosis not present

## 2024-08-22 DIAGNOSIS — J9622 Acute and chronic respiratory failure with hypercapnia: Secondary | ICD-10-CM | POA: Diagnosis not present

## 2024-08-22 DIAGNOSIS — J441 Chronic obstructive pulmonary disease with (acute) exacerbation: Secondary | ICD-10-CM | POA: Diagnosis not present

## 2024-08-22 DIAGNOSIS — J9621 Acute and chronic respiratory failure with hypoxia: Secondary | ICD-10-CM | POA: Diagnosis not present

## 2024-08-22 LAB — BASIC METABOLIC PANEL WITH GFR
Anion gap: 7 (ref 5–15)
BUN: 20 mg/dL (ref 6–20)
CO2: 32 mmol/L (ref 22–32)
Calcium: 9.1 mg/dL (ref 8.9–10.3)
Chloride: 100 mmol/L (ref 98–111)
Creatinine, Ser: 0.78 mg/dL (ref 0.61–1.24)
GFR, Estimated: >60 mL/min
Glucose, Bld: 95 mg/dL (ref 70–99)
Potassium: 4.2 mmol/L (ref 3.5–5.1)
Sodium: 140 mmol/L (ref 135–145)

## 2024-08-22 LAB — CULTURE, BLOOD (ROUTINE X 2)
Culture: NO GROWTH
Culture: NO GROWTH
Special Requests: ADEQUATE
Special Requests: ADEQUATE

## 2024-08-22 LAB — MAGNESIUM: Magnesium: 2.3 mg/dL (ref 1.7–2.4)

## 2024-08-22 MED ORDER — FUROSEMIDE 10 MG/ML IJ SOLN
40.0000 mg | Freq: Once | INTRAMUSCULAR | Status: AC
  Administered 2024-08-22: 40 mg via INTRAVENOUS
  Filled 2024-08-22: qty 4

## 2024-08-22 MED ORDER — ENSURE PLUS HIGH PROTEIN PO LIQD
237.0000 mL | ORAL | Status: DC
  Administered 2024-08-22 – 2024-08-27 (×6): 237 mL via ORAL

## 2024-08-22 NOTE — Plan of Care (Signed)
  Problem: Clinical Measurements: Goal: Ability to maintain clinical measurements within normal limits will improve Outcome: Progressing Goal: Respiratory complications will improve Outcome: Progressing   Problem: Activity: Goal: Risk for activity intolerance will decrease Outcome: Progressing   Problem: Nutrition: Goal: Adequate nutrition will be maintained Outcome: Progressing   Problem: Coping: Goal: Level of anxiety will decrease Outcome: Progressing   

## 2024-08-22 NOTE — Progress Notes (Addendum)
 "  NAME:  Benjamin Casey, MRN:  [id number], DOB:  01-19-1971, LOS: 5 ADMISSION DATE:  08/17/2024, CONSULTATION DATE:  08/17/2024 REFERRING MD:  Dr. Yolande, CHIEF COMPLAINT:  respiratory distress   History of Present Illness:  Patient intubated and sedated all information obtained via chart review and family at bedside  Benjamin Casey is a 53 year old male with history of COPD, HTN, former tobacco abuse who presented to ED by EMS with respiratory distress. On arrival EMS found patient's SpO2 to be 41% on room air. Per patient's aunt his roommate called her earlier today saying that he needed to go to the hospital. When she arrived at the house she found that he was blue in the face and lips, breathing heavily and murmuring incomprehensible sounds only. She does not know if he had any upper respiratory infections prior to becoming acutely ill. He is a former heavy smoker who quit about 15 years ago, former drug use and no alcohol use. He does not wear home oxygen.    He was recently admitted for [facility] from 12/13-12/18 for COPD exacerbation. At that time he was unable to get home O2 given lack of insurance. He was also prescribed steroid taper, duonebs and symbicort. He was referred to a pulmonologist but his aunt does not think he ever went. She also does not think he was compliant with his medications after discharge.   Pertinent  Medical History  COPD HTN Former tobacco abuse   Significant Hospital Events: Including procedures, antibiotic start and stop dates in addition to other pertinent events   08/17/2024: admitted for respiratory distress, COPD exacerbation, Flu A+. Intubated in the ED   Interim History / Subjective:  Still requiring high oxygen levels, on 60% 40 L, still some pulmonary congestion on CXR.  Looks like he is prescribed 20 of Lasix  at home not taking it.  Trying another 40 of Lasix  today Otherwise following commands, tolerating diet without any issues   Objective     Blood pressure (!) 133/96, pulse 70, temperature 98 F (36.7 C), temperature source Axillary, resp. rate 19, height 5' 6 (1.676 m), weight 93.8 kg, SpO2 (!) 88%.    FiO2 (%):  [40 %-80 %] 65 %   Intake/Output Summary (Last 24 hours) at 08/22/2024 0731 Last data filed at 08/22/2024 0331 Gross per 24 hour  Intake 950 ml  Output 3450 ml  Net -2500 ml   Filed Weights   08/18/24 0500 08/19/24 0352 08/20/24 0500  Weight: 93.1 kg 90.5 kg 93.8 kg    Examination: General: Lying in bed HEENT: NCAT, moist mucous membrane Lungs: Symmetrical chest wall movement, clear to auscultation CVS: S1-S2 normal, no murmur ABDOMEN: Soft nontender nondistended Extremities: Warm and no edema Neuro: AO X3, follows commands, moves all extremities GU: Deferred   Resolved problem list   Assessment and Plan  53 year old male with history of COPD, HTN, presents with acute on chronic respiratory failure due to acute COPD exacerbation, and influenza  Acute on chronic respiratory failure with hypoxia and hypercarbia-that required mechanical vent-improved multifactorial due to below COPD exacerbation  Influenza A infection - Extubated on 12/21.  Still requiring high level of oxygen, on 60%, 40 L Still some pulmonary congestion on CXR-looks like was prescribed 20 of Lasix  at home, not taking it Will continue another 40 of Lasix  today Will use BiPAP if needed -Continue ceftriaxone  and Doxy - Continue Tamiflu  - Continue triple therapy LAMA/LABA/ICS, as needed DuoNebs - Continue prednisone  40 mg daily  Encephalopathy-improved likely due to hypercarbia and hypoxia - continue PAD protocol with propofol  and PRN fentanyl  for now  Elevated BNP Euvolemic on exam.  - Obtain Echo   Elevated troponin, likely due to demand ischemia ECG (personally read) NSR with T wave inversion in aVL, aVR and V1. Likely demand ischemia.  - Trend troponin   History of HTN - resume PTA hydralazine , holding PTA  lisinopril  given hyperkalemia   Polycythemia, likely due to chronic hypoxemia-improved - Continue to monitor daily CBC  Nutrition - Tolerating regular diet  To work with PT OT    Labs   CBC: Recent Labs  Lab 08/17/24 1415 08/17/24 1426 08/17/24 1632 08/17/24 1805 08/18/24 0445 08/18/24 0840 08/20/24 0206  WBC 12.6*  --   --  9.8 13.4*  --  8.4  NEUTROABS 9.8*  --   --   --   --   --   --   HGB 17.2*   < > 18.7* 16.1 15.8  16.7 16.3 13.5  HCT 56.1*   < > 55.0* 52.2* 49.5  49.0 48.0 43.4  MCV 98.6  --   --  97.2 93.9  --  96.0  PLT 181  --   --  169 182  --  201   < > = values in this interval not displayed.    Basic Metabolic Panel: Recent Labs  Lab 08/18/24 0445 08/18/24 0840 08/18/24 1635 08/19/24 0236 08/20/24 0206 08/21/24 0117 08/22/24 0212  NA 134*  133* 134*  --  138 141 142 140  K 5.8*  4.9 4.8  --  4.7 4.6 4.4 4.2  CL 87*  --   --  92* 96* 96* 100  CO2 35*  --   --  42* 40* 38* 32  GLUCOSE 123*  --   --  117* 136* 110* 95  BUN 26*  --   --  28* 24* 20 20  CREATININE 1.12  --   --  0.87 0.78 0.71 0.78  CALCIUM  8.7*  --   --  8.5* 8.2* 8.5* 9.1  MG 2.7*  --   --   --  2.4  --  2.3  PHOS 3.3  --  3.4 3.1 3.5  --   --    GFR: Estimated Creatinine Clearance: 113.2 mL/min (by C-G formula based on SCr of 0.78 mg/dL). Recent Labs  Lab 08/17/24 1415 08/17/24 1426 08/17/24 1622 08/17/24 1805 08/18/24 0445 08/20/24 0206  PROCALCITON  --   --   --  0.11  --   --   WBC 12.6*  --   --  9.8 13.4* 8.4  LATICACIDVEN  --  1.5 1.9  --   --   --     Liver Function Tests: Recent Labs  Lab 08/17/24 1415  AST 66*  ALT 46*  ALKPHOS 84  BILITOT 0.5  PROT 7.7  ALBUMIN 4.1   No results for input(s): LIPASE, AMYLASE in the last 168 hours. No results for input(s): AMMONIA in the last 168 hours.  ABG    Component Value Date/Time   PHART 7.439 08/18/2024 0840   PCO2ART 69.5 (HH) 08/18/2024 0840   PO2ART 154 (H) 08/18/2024 0840   HCO3 46.9 (H)  08/18/2024 0840   TCO2 49 (H) 08/18/2024 0840   O2SAT 99 08/18/2024 0840     Coagulation Profile: No results for input(s): INR, PROTIME in the last 168 hours.  Cardiac Enzymes: No results for input(s): CKTOTAL, CKMB, CKMBINDEX, TROPONINI in the last  168 hours.  HbA1C: HB A1C (BAYER DCA - WAIVED)  Date/Time Value Ref Range Status  09/24/2023 09:31 AM 6.5 (H) 4.8 - 5.6 % Final    Comment:             Prediabetes: 5.7 - 6.4          Diabetes: >6.4          Glycemic control for adults with diabetes: <7.0    Hgb A1c MFr Bld  Date/Time Value Ref Range Status  08/17/2024 09:44 PM 6.7 (H) 4.8 - 5.6 % Final    Comment:    (NOTE) Diagnosis of Diabetes The following HbA1c ranges recommended by the American Diabetes Association (ADA) may be used as an aid in the diagnosis of diabetes mellitus.  Hemoglobin             Suggested A1C NGSP%              Diagnosis  <5.7                   Non Diabetic  5.7-6.4                Pre-Diabetic  >6.4                   Diabetic  <7.0                   Glycemic control for                       adults with diabetes.      CBG: Recent Labs  Lab 08/20/24 1526 08/20/24 1913 08/21/24 0734 08/21/24 1105 08/21/24 1545  GLUCAP 155* 119* 74 146* 162*       Critical care time: 35 minutes      Lenny Drought, MD  [facility] Prefer epic messenger for cross cover needs    Critical care time was exclusive of separately billable procedures and treating other patients.  Critical care was necessary to treat or prevent imminent or life-threatening deterioration.  Critical care was time spent personally by me on the following activities: development of treatment plan with patient and/or surrogate as well as nursing, discussions with consultants, evaluation of patient's response to treatment, examination of patient, obtaining history from patient or surrogate, ordering and performing treatments and  interventions, ordering and review of laboratory studies, ordering and review of radiographic studies, pulse oximetry, re-evaluation of patient's condition and participation in multidisciplinary rounds.    "

## 2024-08-22 NOTE — Progress Notes (Addendum)
 Nutrition Follow-up  DOCUMENTATION CODES:  Not applicable  INTERVENTION:  Continue regular diet as ordered Ensure Plus High Protein po at bedtime, each supplement provides 350 kcal and 20 grams of protein.  NUTRITION DIAGNOSIS:  Increased nutrient needs related to chronic illness, acute illness (flu, COPD) as evidenced by estimated needs. - remains applicable  GOAL:  Patient will meet greater than or equal to 90% of their needs - progressing, likely being met  MONITOR:   Labs, Weight trends, Vent status, TF tolerance  REASON FOR ASSESSMENT:   Ventilator, Consult Enteral/tube feeding initiation and management  ASSESSMENT:   Pt admitted with respiratory distress, found to have Flu A. PMH significant for HTN and COPD. Recently admitted to [facility] 12/13-12/18 with COPD exacerbation.  Patient now extubated and on an oral diet. Chest xray improved but still with congestion.   Patient sleeping at time of visit.  Discussed with RN. Pt with good appetite and oral intake.   Given admission for increased respiratory needs and acute illness, will add night time oral nutrition supplement to further support increased nutrition needs.   Meal completions: 12/21: 40% lunch 12/22: 100% breakfast, 100% lunch, 100% dinner 12/23: 100% breakfast  Admit weight: 93.1 kg Current weight: 93.8 kg  Medications: lasix  40mg  once  Labs: reviewed  Diet Order:   Diet Order             Diet regular Room service appropriate? Yes; Fluid consistency: Thin  Diet effective now                   EDUCATION NEEDS:   No education needs have been identified at this time  Skin:  Skin Assessment: Reviewed RN Assessment  Last BM:  12/22  Height:   Ht Readings from Last 1 Encounters:  08/17/24 5' 6 (1.676 m)    Weight:   Wt Readings from Last 1 Encounters:  08/20/24 93.8 kg   BMI:  Body mass index is 33.38 kg/m.  Estimated Nutritional Needs:   Kcal:   1900-2100  Protein:  95-110g  Fluid:  >/=1.9L  [name], RDN, LDN Clinical Nutrition See [facility] for contact information.

## 2024-08-23 ENCOUNTER — Inpatient Hospital Stay (HOSPITAL_COMMUNITY)

## 2024-08-23 DIAGNOSIS — J441 Chronic obstructive pulmonary disease with (acute) exacerbation: Secondary | ICD-10-CM | POA: Diagnosis not present

## 2024-08-23 DIAGNOSIS — J101 Influenza due to other identified influenza virus with other respiratory manifestations: Secondary | ICD-10-CM | POA: Diagnosis not present

## 2024-08-23 DIAGNOSIS — J9622 Acute and chronic respiratory failure with hypercapnia: Secondary | ICD-10-CM | POA: Diagnosis not present

## 2024-08-23 DIAGNOSIS — J9621 Acute and chronic respiratory failure with hypoxia: Secondary | ICD-10-CM | POA: Diagnosis not present

## 2024-08-23 LAB — BASIC METABOLIC PANEL WITH GFR
Anion gap: 8 (ref 5–15)
BUN: 24 mg/dL — ABNORMAL HIGH (ref 6–20)
CO2: 34 mmol/L — ABNORMAL HIGH (ref 22–32)
Calcium: 9.2 mg/dL (ref 8.9–10.3)
Chloride: 97 mmol/L — ABNORMAL LOW (ref 98–111)
Creatinine, Ser: 0.85 mg/dL (ref 0.61–1.24)
GFR, Estimated: >60 mL/min
Glucose, Bld: 99 mg/dL (ref 70–99)
Potassium: 4 mmol/L (ref 3.5–5.1)
Sodium: 139 mmol/L (ref 135–145)

## 2024-08-23 MED ORDER — IPRATROPIUM-ALBUTEROL 0.5-2.5 (3) MG/3ML IN SOLN
3.0000 mL | RESPIRATORY_TRACT | Status: DC
  Administered 2024-08-23 – 2024-08-24 (×3): 3 mL via RESPIRATORY_TRACT
  Filled 2024-08-23 (×2): qty 3

## 2024-08-23 MED ORDER — FUROSEMIDE 10 MG/ML IJ SOLN
40.0000 mg | Freq: Once | INTRAMUSCULAR | Status: AC
  Administered 2024-08-23: 40 mg via INTRAVENOUS
  Filled 2024-08-23: qty 4

## 2024-08-23 MED ORDER — IPRATROPIUM-ALBUTEROL 0.5-2.5 (3) MG/3ML IN SOLN: 3.0000 mL | RESPIRATORY_TRACT | Status: DC | PRN

## 2024-08-23 NOTE — Progress Notes (Signed)
 PT taken off HHFNC and placed on BIPAP at this time. Tolerating well.   08/23/24 2000  BiPAP/CPAP/SIPAP  $ Face Mask Medium Yes  BiPAP/CPAP/SIPAP Pt Type Adult  BiPAP/CPAP/SIPAP SERVO  Mask Type Full face mask  Dentures removed? Not applicable  Mask Size Medium  Set Rate 15 breaths/min  Respiratory Rate 28 breaths/min  IPAP 10 cmH20  EPAP 5 cmH2O  Pressure Support  (PC 5)  PEEP 5 cmH20  FiO2 (%) 50 %  Flow Rate 0 lpm  Minute Ventilation 16.6  Leak 48  Peak Inspiratory Pressure (PIP) 10  Tidal Volume (Vt) 502  Patient Home Machine No  Patient Home Mask No  Patient Home Tubing No  Press High Alarm 40 cmH2O  Press Low Alarm 2 cmH2O  Nasal massage performed Yes  CPAP/SIPAP surface wiped down Yes  Device Plugged into RED Power Outlet Yes

## 2024-08-23 NOTE — TOC Progression Note (Signed)
 Transition of Care [facility]) - Progression Note    Patient Details  Name: Benjamin Casey MRN: [id number] Date of Birth: [birth year]  Transition of Care [facility]) CM/SW Contact  Lendia Dais, [place] Phone Number: 08/23/2024, 1:54 PM  Clinical Narrative: Pt is not yet medically stable and remains of 40L of high flow O2.  RN inquired about the status of the pt being screened for disability. CSW sent email to the financial navigator.  CSW will continue to monitor.                      Expected Discharge Plan and Services                                               Social Drivers of Health (SDOH) Interventions SDOH Screenings   Food Insecurity: Patient Unable To Answer (08/17/2024)  Housing: Patient Unable To Answer (08/17/2024)  Transportation Needs: Patient Unable To Answer (08/17/2024)  Utilities: Patient Unable To Answer (08/17/2024)  Depression (PHQ2-9): Low Risk (09/24/2023)  Stress: No Stress Concern Present (08/09/2024)   Received from [facility]  Tobacco Use: Medium Risk (08/11/2024)   Received from [facility]    Readmission Risk Interventions     No data to display

## 2024-08-23 NOTE — Progress Notes (Signed)
 "  NAME:  Benjamin Casey, MRN:  [id number], DOB:  May 01, 1971, LOS: 6 ADMISSION DATE:  08/17/2024, CONSULTATION DATE:  08/17/2024 REFERRING MD:  Dr. Yolande, CHIEF COMPLAINT:  respiratory distress   History of Present Illness:  Patient intubated and sedated all information obtained via chart review and family at bedside  Benjamin Casey is a 53 year old male with history of COPD, HTN, former tobacco abuse who presented to ED by EMS with respiratory distress. On arrival EMS found patient's SpO2 to be 41% on room air. Per patient's aunt his roommate called her earlier today saying that he needed to go to the hospital. When she arrived at the house she found that he was blue in the face and lips, breathing heavily and murmuring incomprehensible sounds only. She does not know if he had any upper respiratory infections prior to becoming acutely ill. He is a former heavy smoker who quit about 15 years ago, former drug use and no alcohol use. He does not wear home oxygen.    He was recently admitted for [facility] from 12/13-12/18 for COPD exacerbation. At that time he was unable to get home O2 given lack of insurance. He was also prescribed steroid taper, duonebs and symbicort. He was referred to a pulmonologist but his aunt does not think he ever went. She also does not think he was compliant with his medications after discharge.   Pertinent  Medical History  COPD HTN Former tobacco abuse   Significant Hospital Events: Including procedures, antibiotic start and stop dates in addition to other pertinent events   08/17/2024: admitted for respiratory distress, COPD exacerbation, Flu A+. Intubated in the ED   Interim History / Subjective:  Slowly improving, have some hypoxic event overnight highly suspect due to OSA. Still requiring heated high flow 60%, 40 L, to maintain sats> 88% Will need night BiPAP Gave another dose of 40 of Lasix  Following commands, tolerating diet without any  issues    Objective    Blood pressure 115/77, pulse 67, temperature 97.9 F (36.6 C), temperature source Oral, resp. rate 19, height 5' 6 (1.676 m), weight 93.8 kg, SpO2 (!) 86%.    FiO2 (%):  [60 %-80 %] 80 %   Intake/Output Summary (Last 24 hours) at 08/23/2024 0727 Last data filed at 08/23/2024 9367 Gross per 24 hour  Intake 240 ml  Output 1825 ml  Net -1585 ml   Filed Weights   08/18/24 0500 08/19/24 0352 08/20/24 0500  Weight: 93.1 kg 90.5 kg 93.8 kg    Examination: General: Lying in bed, on heated high flow HEENT: NCAT, moist mucous membrane Lungs: Symmetrical chest wall movement, clear to auscultation CVS: S1-S2 normal, no murmur Abdomen: Soft nontender nondistended Extremities: Warm, no edema Neuro: AO X3 follow commands, move all extremities GU: Deferred  Resolved problem list   Assessment and Plan  53 year old male with history of COPD, HTN, presents with acute on chronic respiratory failure due to acute COPD exacerbation, and influenza  Acute on chronic respiratory failure with hypoxia and hypercarbia-that required mechanical vent-improved multifactorial due to below COPD exacerbation  Influenza A infection - Extubated on 12/21.  Still requiring high level of oxygen, on 60%, 40 L Still some pulmonary congestion on CXR-looks like was prescribed 20 of Lasix  at home, not taking it Will continue another 40 of Lasix  today Will use BiPAP if needed -Continue ceftriaxone  and Doxy - Continue Tamiflu  - Continue triple therapy LAMA/LABA/ICS, as needed DuoNebs - Continue prednisone  40 mg daily -  Slowly improving, with some hypoxic event especially overnight. - Continue to wean off heated high flow will aim for sats> 88% - For night BiPAP, and during the day while napping - Will give another Lasix  40   Encephalopathy-improved likely due to hypercarbia and hypoxia - continue PAD protocol with propofol  and PRN fentanyl  for now  Elevated BNP Euvolemic on exam.   - Obtain Echo   Elevated troponin, likely due to demand ischemia ECG (personally read) NSR with T wave inversion in aVL, aVR and V1. Likely demand ischemia.  - Trend troponin   History of HTN - resume PTA hydralazine , holding PTA lisinopril  given hyperkalemia   Polycythemia, likely due to chronic hypoxemia-improved - Continue to monitor daily CBC  Nutrition - Tolerating regular diet  Working with PT OT   Last updated father on 12/24 Father, [phone]     Labs   CBC: Recent Labs  Lab 08/17/24 1415 08/17/24 1426 08/17/24 1632 08/17/24 1805 08/18/24 0445 08/18/24 0840 08/20/24 0206  WBC 12.6*  --   --  9.8 13.4*  --  8.4  NEUTROABS 9.8*  --   --   --   --   --   --   HGB 17.2*   < > 18.7* 16.1 15.8  16.7 16.3 13.5  HCT 56.1*   < > 55.0* 52.2* 49.5  49.0 48.0 43.4  MCV 98.6  --   --  97.2 93.9  --  96.0  PLT 181  --   --  169 182  --  201   < > = values in this interval not displayed.    Basic Metabolic Panel: Recent Labs  Lab 08/18/24 0445 08/18/24 0840 08/18/24 1635 08/19/24 0236 08/20/24 0206 08/21/24 0117 08/22/24 0212 08/23/24 0202  NA 134*  133*   < >  --  138 141 142 140 139  K 5.8*  4.9   < >  --  4.7 4.6 4.4 4.2 4.0  CL 87*  --   --  92* 96* 96* 100 97*  CO2 35*  --   --  42* 40* 38* 32 34*  GLUCOSE 123*  --   --  117* 136* 110* 95 99  BUN 26*  --   --  28* 24* 20 20 24*  CREATININE 1.12  --   --  0.87 0.78 0.71 0.78 0.85  CALCIUM  8.7*  --   --  8.5* 8.2* 8.5* 9.1 9.2  MG 2.7*  --   --   --  2.4  --  2.3  --   PHOS 3.3  --  3.4 3.1 3.5  --   --   --    < > = values in this interval not displayed.   GFR: Estimated Creatinine Clearance: 106.5 mL/min (by C-G formula based on SCr of 0.85 mg/dL). Recent Labs  Lab 08/17/24 1415 08/17/24 1426 08/17/24 1622 08/17/24 1805 08/18/24 0445 08/20/24 0206  PROCALCITON  --   --   --  0.11  --   --   WBC 12.6*  --   --  9.8 13.4* 8.4  LATICACIDVEN  --  1.5 1.9  --   --   --     Liver  Function Tests: Recent Labs  Lab 08/17/24 1415  AST 66*  ALT 46*  ALKPHOS 84  BILITOT 0.5  PROT 7.7  ALBUMIN 4.1   No results for input(s): LIPASE, AMYLASE in the last 168 hours. No results for input(s): AMMONIA in the last  168 hours.  ABG    Component Value Date/Time   PHART 7.439 08/18/2024 0840   PCO2ART 69.5 (HH) 08/18/2024 0840   PO2ART 154 (H) 08/18/2024 0840   HCO3 46.9 (H) 08/18/2024 0840   TCO2 49 (H) 08/18/2024 0840   O2SAT 99 08/18/2024 0840     Coagulation Profile: No results for input(s): INR, PROTIME in the last 168 hours.  Cardiac Enzymes: No results for input(s): CKTOTAL, CKMB, CKMBINDEX, TROPONINI in the last 168 hours.  HbA1C: HB A1C (BAYER DCA - WAIVED)  Date/Time Value Ref Range Status  09/24/2023 09:31 AM 6.5 (H) 4.8 - 5.6 % Final    Comment:             Prediabetes: 5.7 - 6.4          Diabetes: >6.4          Glycemic control for adults with diabetes: <7.0    Hgb A1c MFr Bld  Date/Time Value Ref Range Status  08/17/2024 09:44 PM 6.7 (H) 4.8 - 5.6 % Final    Comment:    (NOTE) Diagnosis of Diabetes The following HbA1c ranges recommended by the American Diabetes Association (ADA) may be used as an aid in the diagnosis of diabetes mellitus.  Hemoglobin             Suggested A1C NGSP%              Diagnosis  <5.7                   Non Diabetic  5.7-6.4                Pre-Diabetic  >6.4                   Diabetic  <7.0                   Glycemic control for                       adults with diabetes.      CBG: Recent Labs  Lab 08/20/24 1526 08/20/24 1913 08/21/24 0734 08/21/24 1105 08/21/24 1545  GLUCAP 155* 119* 74 146* 162*       Critical care time: 30 minutes      Lenny Drought, MD  [facility] Prefer epic messenger for cross cover needs    Critical care time was exclusive of separately billable procedures and treating other patients.  Critical care was necessary to  treat or prevent imminent or life-threatening deterioration.  Critical care was time spent personally by me on the following activities: development of treatment plan with patient and/or surrogate as well as nursing, discussions with consultants, evaluation of patient's response to treatment, examination of patient, obtaining history from patient or surrogate, ordering and performing treatments and interventions, ordering and review of laboratory studies, ordering and review of radiographic studies, pulse oximetry, re-evaluation of patient's condition and participation in multidisciplinary rounds.    "

## 2024-08-23 NOTE — Evaluation (Signed)
 Physical Therapy Brief Evaluation and Discharge Note Patient Details Name: Benjamin Casey MRN: [id number] DOB: 1971/07/08 Today's Date: 08/23/2024   History of Present Illness  Pt is 53 yo presenting to [facility] on 12/18 with shortness of breathe. Pt with O2 sats in 40s on arrival.  Pt was intubated in the ED and extubated same day. Pt with COPD exacerbation, Flu A +. Previously at [facility] earlier in the month but unable to Get O2 due to insurance issues.  PMH: COPD, HTN.  Clinical Impression  Pt is currently presenting at Ind for bed mobility, sit to stand and gait. Pt was able to take steps in all directions with no balance deficits noted. O2 sats remained above 88% on HHF oxygen . Pt is limited by respiratory status but functionally is moving well. Pt will benefit from continued mobility while in the hospital by mobility team and nursing staff but due to pt is at baseline level of functioning does not have needs for acute physical therapy services at this time. Pt may benefit from cardiopulmonary rehab after discharge. Currently pt is presenting near baseline level of physical functioning and no skilled physical therapy services recommended. Pt will be discharged from skilled physical therapy services at this time; please re-consult if further needs arise.           PT Assessment Patient does not need any further PT services  Assistance Needed at Discharge  PRN    Equipment Recommendations None recommended by PT     Precautions/Restrictions Precautions Precautions: Other (comment) Recall of Precautions/Restrictions: Intact Precaution/Restrictions Comments: O2 sats Restrictions Weight Bearing Restrictions Per Provider Order: No        Mobility  Bed Mobility Rolling: Independent Supine/Sidelying to sit: Independent Sit to supine/sidelying: Independent    Transfers Overall transfer level: Independent Equipment used: None      Ambulation/Gait Ambulation/Gait assistance:  Independent Gait Distance (Feet): 20 Feet Assistive device: None Gait Pattern/deviations: Step-through pattern, Decreased stride length Gait Speed: Pace WFL General Gait Details: O2 sats remained above 88%  with good pleth line during activity. Pt able to take steps forward, back and to the side; limited due to heated high flow.  Home Activity Instructions Home Activity Instructions: keep moving.  Stairs Stairs:  (demonstrates good strength to perform stairs per home set up wtih sit to stand and gait if necessary though pt reports all homes have level entries)           Balance Overall balance assessment: Needs assistance Sitting-balance support: No upper extremity supported, Feet supported Sitting balance-Leahy Scale: Normal     Standing balance support: No upper extremity supported Standing balance-Leahy Scale: Good            Pertinent Vitals/Pain   Pain Assessment Pain Assessment: No/denies pain     Home Living Family/patient expects to be discharged to:: Private residence Living Arrangements: Other relatives (aunt but may go to live with friend) Available Help at Discharge: Family;Friend(s);Available 24 hours/day Home Environment: Level entry   Home Equipment: None        Prior Function Level of Independence: Independent      UE/LE Assessment   UE ROM/Strength/Tone/Coordination: WFL    LE ROM/Strength/Tone/Coordination: [facility]      Communication   Communication Communication: No apparent difficulties     Cognition Overall Cognitive Status: Appears within functional limits for tasks assessed/performed       General Comments General comments (skin integrity, edema, etc.): Vitals remained stable on HHF at 40L 60%  Assessment/Plan           No Skilled PT Patient at baseline level of functioning;Other (comment) (only limited by respiratory status not functional status)    AMPAC 6 Clicks Help needed turning from your back to your side  while in a flat bed without using bedrails?: None Help needed moving from lying on your back to sitting on the side of a flat bed without using bedrails?: None Help needed moving to and from a bed to a chair (including a wheelchair)?: None Help needed standing up from a chair using your arms (e.g., wheelchair or bedside chair)?: None Help needed to walk in hospital room?: None Help needed climbing 3-5 steps with a railing? : None 6 Click Score: 24      End of Session   Activity Tolerance: Patient tolerated treatment well Patient left: in bed;with call bell/phone within reach Nurse Communication: Mobility status       Time: 8697-8686 PT Time Calculation (min) (ACUTE ONLY): 11 min  Charges:   PT Evaluation $PT Eval Low Complexity: 1 Low     Dorothyann Maier, DPT, CLT  Acute Rehabilitation Services Office: [phone] (Secure chat preferred)   Dorothyann VEAR Maier  08/23/2024, 1:29 PM

## 2024-08-23 NOTE — Plan of Care (Signed)
  Problem: Safety: Goal: Non-violent Restraint(s) Outcome: Progressing   Problem: Education: Goal: Knowledge of General Education information will improve Description: Including pain rating scale, medication(s)/side effects and non-pharmacologic comfort measures Outcome: Progressing   Problem: Clinical Measurements: Goal: Diagnostic test results will improve Outcome: Progressing

## 2024-08-24 ENCOUNTER — Inpatient Hospital Stay (HOSPITAL_COMMUNITY)

## 2024-08-24 DIAGNOSIS — J101 Influenza due to other identified influenza virus with other respiratory manifestations: Secondary | ICD-10-CM | POA: Diagnosis not present

## 2024-08-24 DIAGNOSIS — J9621 Acute and chronic respiratory failure with hypoxia: Secondary | ICD-10-CM | POA: Diagnosis not present

## 2024-08-24 DIAGNOSIS — J9622 Acute and chronic respiratory failure with hypercapnia: Secondary | ICD-10-CM | POA: Diagnosis not present

## 2024-08-24 DIAGNOSIS — J441 Chronic obstructive pulmonary disease with (acute) exacerbation: Secondary | ICD-10-CM | POA: Diagnosis not present

## 2024-08-24 MED ORDER — IPRATROPIUM-ALBUTEROL 0.5-2.5 (3) MG/3ML IN SOLN
3.0000 mL | Freq: Four times a day (QID) | RESPIRATORY_TRACT | Status: DC
  Administered 2024-08-24 – 2024-08-26 (×8): 3 mL via RESPIRATORY_TRACT
  Filled 2024-08-24 (×10): qty 3

## 2024-08-24 NOTE — Progress Notes (Signed)
 "  NAME:  Benjamin Casey, MRN:  [id number], DOB:  [birth year], LOS: 7 ADMISSION DATE:  08/17/2024, CONSULTATION DATE:  08/17/2024 REFERRING MD:  Dr. Yolande, CHIEF COMPLAINT:  respiratory distress   History of Present Illness:  Patient intubated and sedated all information obtained via chart review and family at bedside  [name] is a 53 year old male with history of COPD, HTN, former tobacco abuse who presented to ED by EMS with respiratory distress. On arrival EMS found patient's SpO2 to be 41% on room air. Per patient's aunt his roommate called her earlier today saying that he needed to go to the hospital. When she arrived at the house she found that he was blue in the face and lips, breathing heavily and murmuring incomprehensible sounds only. She does not know if he had any upper respiratory infections prior to becoming acutely ill. He is a former heavy smoker who quit about 15 years ago, former drug use and no alcohol use. He does not wear home oxygen.    He was recently admitted for [facility] from 12/13-12/18 for COPD exacerbation. At that time he was unable to get home O2 given lack of insurance. He was also prescribed steroid taper, duonebs and symbicort. He was referred to a pulmonologist but his aunt does not think he ever went. She also does not think he was compliant with his medications after discharge.   Pertinent  Medical History  COPD HTN Former tobacco abuse   Significant Hospital Events: Including procedures, antibiotic start and stop dates in addition to other pertinent events   08/17/2024: admitted for respiratory distress, COPD exacerbation, Flu A+. Intubated in the ED   Interim History / Subjective:  Still on heated high flow 40 L, 60% to maintain sats> 88%, used BiPAP overnight Slowly improving Seems to be improving with Lasix , will give another 40 of Lasix  Follow commands, tried to ambulate and is tolerating diet without any issues  Objective    Blood pressure  116/84, pulse 75, temperature (!) 97 F (36.1 C), temperature source Axillary, resp. rate (!) 22, height 5' 6 (1.676 m), weight 93.8 kg, SpO2 (!) 87%.    FiO2 (%):  [40 %-70 %] 61 % PEEP:  [5 cmH20] 5 cmH20   Intake/Output Summary (Last 24 hours) at 08/24/2024 9176 Last data filed at 08/24/2024 0600 Gross per 24 hour  Intake 360 ml  Output 1350 ml  Net -990 ml   Filed Weights   08/18/24 0500 08/19/24 0352 08/20/24 0500  Weight: 93.1 kg 90.5 kg 93.8 kg    Examination: General: Lying in bed, on heated high flow HEENT: NCAT, moist mucous membrane Lungs: Symmetrical chest movement, clear to auscultation CVS: S1-S2 normal, no murmur Abdomen: Soft nontender nondistended Extremities: Warm, no edema Neuro: AO X3, follow commands, move all extremities GU: Deferred   Resolved problem list   Assessment and Plan  53 year old male with history of COPD, HTN, presents with acute on chronic respiratory failure due to acute COPD exacerbation, and influenza  Acute on chronic respiratory failure with hypoxia and hypercarbia-that required mechanical vent-improved multifactorial due to below COPD exacerbation  Influenza A infection - Extubated on 12/21.  Still requiring high level of oxygen, on 60%, 40 L Still some pulmonary congestion on CXR-looks like was prescribed 20 of Lasix  at home, not taking it Will continue another 40 of Lasix  today Will use BiPAP if needed -Continue ceftriaxone  and Doxy - Continue Tamiflu  - Continue triple therapy LAMA/LABA/ICS, as needed DuoNebs -  Continue prednisone  40 mg daily -Slowly improving, with some hypoxic event especially overnight. - Continue to wean off heated high flow will aim for sats> 88% - For night BiPAP, and during the day while napping 12/25: Still on high level oxygen, on heated high flow 40L, 60% to maintain sats of> 88% Will continue weaning off, otherwise continue night BiPAP - Continue Lasix  40 daily   Encephalopathy-improved  likely due to hypercarbia and hypoxia - continue PAD protocol with propofol  and PRN fentanyl  for now  Elevated BNP Euvolemic on exam.  - Obtain Echo   Elevated troponin, likely due to demand ischemia ECG (personally read) NSR with T wave inversion in aVL, aVR and V1. Likely demand ischemia.  - Trend troponin   History of HTN - resume PTA hydralazine , holding PTA lisinopril  given hyperkalemia   Polycythemia, likely due to chronic hypoxemia-improved - Continue to monitor daily CBC  Nutrition - Tolerating regular diet  Working with PT OT   Last updated father on 12/24 Father, [phone]     Labs   CBC: Recent Labs  Lab 08/17/24 1415 08/17/24 1426 08/17/24 1632 08/17/24 1805 08/18/24 0445 08/18/24 0840 08/20/24 0206  WBC 12.6*  --   --  9.8 13.4*  --  8.4  NEUTROABS 9.8*  --   --   --   --   --   --   HGB 17.2*   < > 18.7* 16.1 15.8  16.7 16.3 13.5  HCT 56.1*   < > 55.0* 52.2* 49.5  49.0 48.0 43.4  MCV 98.6  --   --  97.2 93.9  --  96.0  PLT 181  --   --  169 182  --  201   < > = values in this interval not displayed.    Basic Metabolic Panel: Recent Labs  Lab 08/18/24 0445 08/18/24 0840 08/18/24 1635 08/19/24 0236 08/20/24 0206 08/21/24 0117 08/22/24 0212 08/23/24 0202  NA 134*  133*   < >  --  138 141 142 140 139  K 5.8*  4.9   < >  --  4.7 4.6 4.4 4.2 4.0  CL 87*  --   --  92* 96* 96* 100 97*  CO2 35*  --   --  42* 40* 38* 32 34*  GLUCOSE 123*  --   --  117* 136* 110* 95 99  BUN 26*  --   --  28* 24* 20 20 24*  CREATININE 1.12  --   --  0.87 0.78 0.71 0.78 0.85  CALCIUM  8.7*  --   --  8.5* 8.2* 8.5* 9.1 9.2  MG 2.7*  --   --   --  2.4  --  2.3  --   PHOS 3.3  --  3.4 3.1 3.5  --   --   --    < > = values in this interval not displayed.   GFR: Estimated Creatinine Clearance: 106.5 mL/min (by C-G formula based on SCr of 0.85 mg/dL). Recent Labs  Lab 08/17/24 1415 08/17/24 1426 08/17/24 1622 08/17/24 1805 08/18/24 0445 08/20/24 0206   PROCALCITON  --   --   --  0.11  --   --   WBC 12.6*  --   --  9.8 13.4* 8.4  LATICACIDVEN  --  1.5 1.9  --   --   --     Liver Function Tests: Recent Labs  Lab 08/17/24 1415  AST 66*  ALT 46*  ALKPHOS 84  BILITOT 0.5  PROT 7.7  ALBUMIN 4.1   No results for input(s): LIPASE, AMYLASE in the last 168 hours. No results for input(s): AMMONIA in the last 168 hours.  ABG    Component Value Date/Time   PHART 7.439 08/18/2024 0840   PCO2ART 69.5 (HH) 08/18/2024 0840   PO2ART 154 (H) 08/18/2024 0840   HCO3 46.9 (H) 08/18/2024 0840   TCO2 49 (H) 08/18/2024 0840   O2SAT 99 08/18/2024 0840     Coagulation Profile: No results for input(s): INR, PROTIME in the last 168 hours.  Cardiac Enzymes: No results for input(s): CKTOTAL, CKMB, CKMBINDEX, TROPONINI in the last 168 hours.  HbA1C: HB A1C (BAYER DCA - WAIVED)  Date/Time Value Ref Range Status  09/24/2023 09:31 AM 6.5 (H) 4.8 - 5.6 % Final    Comment:             Prediabetes: 5.7 - 6.4          Diabetes: >6.4          Glycemic control for adults with diabetes: <7.0    Hgb A1c MFr Bld  Date/Time Value Ref Range Status  08/17/2024 09:44 PM 6.7 (H) 4.8 - 5.6 % Final    Comment:    (NOTE) Diagnosis of Diabetes The following HbA1c ranges recommended by the American Diabetes Association (ADA) may be used as an aid in the diagnosis of diabetes mellitus.  Hemoglobin             Suggested A1C NGSP%              Diagnosis  <5.7                   Non Diabetic  5.7-6.4                Pre-Diabetic  >6.4                   Diabetic  <7.0                   Glycemic control for                       adults with diabetes.      CBG: Recent Labs  Lab 08/20/24 1526 08/20/24 1913 08/21/24 0734 08/21/24 1105 08/21/24 1545  GLUCAP 155* 119* 74 146* 162*       Critical care time: 30 minutes      Lenny Drought, MD  [facility] Prefer epic messenger for cross cover  needs    Critical care time was exclusive of separately billable procedures and treating other patients.  Critical care was necessary to treat or prevent imminent or life-threatening deterioration.  Critical care was time spent personally by me on the following activities: development of treatment plan with patient and/or surrogate as well as nursing, discussions with consultants, evaluation of patient's response to treatment, examination of patient, obtaining history from patient or surrogate, ordering and performing treatments and interventions, ordering and review of laboratory studies, ordering and review of radiographic studies, pulse oximetry, re-evaluation of patient's condition and participation in multidisciplinary rounds.    "

## 2024-08-24 NOTE — Progress Notes (Signed)
 RT NOTE: patient removed from bipap and placed on heated high flow nasal cannula.  Tolerating well at this time.  Will continue to monitor.

## 2024-08-25 ENCOUNTER — Inpatient Hospital Stay (HOSPITAL_COMMUNITY)

## 2024-08-25 DIAGNOSIS — J441 Chronic obstructive pulmonary disease with (acute) exacerbation: Secondary | ICD-10-CM | POA: Diagnosis not present

## 2024-08-25 DIAGNOSIS — J9622 Acute and chronic respiratory failure with hypercapnia: Secondary | ICD-10-CM | POA: Diagnosis not present

## 2024-08-25 DIAGNOSIS — J101 Influenza due to other identified influenza virus with other respiratory manifestations: Secondary | ICD-10-CM | POA: Diagnosis not present

## 2024-08-25 DIAGNOSIS — J9621 Acute and chronic respiratory failure with hypoxia: Secondary | ICD-10-CM | POA: Diagnosis not present

## 2024-08-25 LAB — CBC
HCT: 51.6 % (ref 39.0–52.0)
Hemoglobin: 16.6 g/dL (ref 13.0–17.0)
MCH: 30.1 pg (ref 26.0–34.0)
MCHC: 32.2 g/dL (ref 30.0–36.0)
MCV: 93.5 fL (ref 80.0–100.0)
Platelets: 311 K/uL (ref 150–400)
RBC: 5.52 MIL/uL (ref 4.22–5.81)
RDW: 14.4 % (ref 11.5–15.5)
WBC: 8.3 K/uL (ref 4.0–10.5)
nRBC: 0 % (ref 0.0–0.2)

## 2024-08-25 LAB — BASIC METABOLIC PANEL WITH GFR
Anion gap: 8 (ref 5–15)
BUN: 17 mg/dL (ref 6–20)
CO2: 35 mmol/L — ABNORMAL HIGH (ref 22–32)
Calcium: 9.1 mg/dL (ref 8.9–10.3)
Chloride: 95 mmol/L — ABNORMAL LOW (ref 98–111)
Creatinine, Ser: 0.85 mg/dL (ref 0.61–1.24)
GFR, Estimated: >60 mL/min
Glucose, Bld: 98 mg/dL (ref 70–99)
Potassium: 4.3 mmol/L (ref 3.5–5.1)
Sodium: 138 mmol/L (ref 135–145)

## 2024-08-25 NOTE — Progress Notes (Signed)
 "  NAME:  Benjamin Casey, MRN:  [id number], DOB:  25-Aug-1971, LOS: 8 ADMISSION DATE:  08/17/2024, CONSULTATION DATE:  08/17/2024 REFERRING MD:  Dr. Yolande, CHIEF COMPLAINT:  respiratory distress   History of Present Illness:  Patient intubated and sedated all information obtained via chart review and family at bedside  Benjamin Casey is a 53 year old male with history of COPD, HTN, former tobacco abuse who presented to ED by EMS with respiratory distress. On arrival EMS found patient's SpO2 to be 41% on room air. Per patient's aunt his roommate called her earlier today saying that he needed to go to the hospital. When she arrived at the house she found that he was blue in the face and lips, breathing heavily and murmuring incomprehensible sounds only. She does not know if he had any upper respiratory infections prior to becoming acutely ill. He is a former heavy smoker who quit about 15 years ago, former drug use and no alcohol use. He does not wear home oxygen.    He was recently admitted for [facility] from 12/13-12/18 for COPD exacerbation. At that time he was unable to get home O2 given lack of insurance. He was also prescribed steroid taper, duonebs and symbicort. He was referred to a pulmonologist but his aunt does not think he ever went. She also does not think he was compliant with his medications after discharge.   Pertinent  Medical History  COPD HTN Former tobacco abuse   Significant Hospital Events: Including procedures, antibiotic start and stop dates in addition to other pertinent events   08/17/2024: admitted for respiratory distress, COPD exacerbation, Flu A+. Intubated in the ED   Interim History / Subjective:  Improving, now on 9 L maintain sats > 88%, sometimes needs more Used BiPAP overnight Will continue Lasix  40 Follows commands, ambulating and tolerating diet without any issues Will transfer to PCU today  Objective    Blood pressure 108/76, pulse 73, temperature  98.2 F (36.8 C), resp. rate 19, height 5' 6 (1.676 m), weight 93.8 kg, SpO2 (!) 88%.    FiO2 (%):  [50 %-61 %] 50 %   Intake/Output Summary (Last 24 hours) at 08/25/2024 0730 Last data filed at 08/25/2024 0533 Gross per 24 hour  Intake 1200 ml  Output 1325 ml  Net -125 ml   Filed Weights   08/18/24 0500 08/19/24 0352 08/20/24 0500  Weight: 93.1 kg 90.5 kg 93.8 kg    Examination: General: Lying in bed, not in distress HEENT: NCAT, moist mucous membrane Lungs: Symmetrical chest movement, clear to auscultation CVS: S1-S2 normal, no murmur Abdomen: Soft nontender nondistended Extremities: Warm, no edema Neuro: AO X3, follows commands, moves all extremities GU: Deferred   Resolved problem list   Assessment and Plan  53 year old male with history of COPD, HTN, presents with acute on chronic respiratory failure due to acute COPD exacerbation, and influenza  Acute on chronic respiratory failure with hypoxia and hypercarbia-that required mechanical vent-improved multifactorial due to below COPD exacerbation  Influenza A infection - Extubated on 12/21.  Still some pulmonary congestion on CXR-looks like was prescribed 20 of Lasix  at home, not taking it Will continue another 40 of Lasix   -Continue ceftriaxone  and Doxy - Continue Tamiflu  - Continue triple therapy LAMA/LABA/ICS, as needed DuoNebs - Continue prednisone  40 mg daily - Improving now on 9 L, sometimes needs more heated high flow - Wean off nasal cannula to maintain sats> 88% - Use BiPAP at night - Continue Lasix  40 -  Will need home oxygen, and follow-up with pulmonology as an outpatient   Encephalopathy-improved likely due to hypercarbia and hypoxia-improved - continue PAD protocol with propofol  and PRN fentanyl  for now   History of HTN - resume PTA hydralazine , holding PTA lisinopril  given hyperkalemia   Polycythemia, likely due to chronic hypoxemia-improved - Continue to monitor daily CBC  Nutrition -  Tolerating regular diet  Family updated at bedside  Dispo: Transfer to PCU   Labs   CBC: Recent Labs  Lab 08/18/24 0840 08/20/24 0206  WBC  --  8.4  HGB 16.3 13.5  HCT 48.0 43.4  MCV  --  96.0  PLT  --  201    Basic Metabolic Panel: Recent Labs  Lab 08/18/24 1635 08/19/24 0236 08/20/24 0206 08/21/24 0117 08/22/24 0212 08/23/24 0202  NA  --  138 141 142 140 139  K  --  4.7 4.6 4.4 4.2 4.0  CL  --  92* 96* 96* 100 97*  CO2  --  42* 40* 38* 32 34*  GLUCOSE  --  117* 136* 110* 95 99  BUN  --  28* 24* 20 20 24*  CREATININE  --  0.87 0.78 0.71 0.78 0.85  CALCIUM   --  8.5* 8.2* 8.5* 9.1 9.2  MG  --   --  2.4  --  2.3  --   PHOS 3.4 3.1 3.5  --   --   --    GFR: Estimated Creatinine Clearance: 106.5 mL/min (by C-G formula based on SCr of 0.85 mg/dL). Recent Labs  Lab 08/20/24 0206  WBC 8.4    Liver Function Tests: No results for input(s): AST, ALT, ALKPHOS, BILITOT, PROT, ALBUMIN in the last 168 hours.  No results for input(s): LIPASE, AMYLASE in the last 168 hours. No results for input(s): AMMONIA in the last 168 hours.  ABG    Component Value Date/Time   PHART 7.439 08/18/2024 0840   PCO2ART 69.5 (HH) 08/18/2024 0840   PO2ART 154 (H) 08/18/2024 0840   HCO3 46.9 (H) 08/18/2024 0840   TCO2 49 (H) 08/18/2024 0840   O2SAT 99 08/18/2024 0840     Coagulation Profile: No results for input(s): INR, PROTIME in the last 168 hours.  Cardiac Enzymes: No results for input(s): CKTOTAL, CKMB, CKMBINDEX, TROPONINI in the last 168 hours.  HbA1C: HB A1C (BAYER DCA - WAIVED)  Date/Time Value Ref Range Status  09/24/2023 09:31 AM 6.5 (H) 4.8 - 5.6 % Final    Comment:             Prediabetes: 5.7 - 6.4          Diabetes: >6.4          Glycemic control for adults with diabetes: <7.0    Hgb A1c MFr Bld  Date/Time Value Ref Range Status  08/17/2024 09:44 PM 6.7 (H) 4.8 - 5.6 % Final    Comment:    (NOTE) Diagnosis of Diabetes The  following HbA1c ranges recommended by the American Diabetes Association (ADA) may be used as an aid in the diagnosis of diabetes mellitus.  Hemoglobin             Suggested A1C NGSP%              Diagnosis  <5.7                   Non Diabetic  5.7-6.4                Pre-Diabetic  >  6.4                   Diabetic  <7.0                   Glycemic control for                       adults with diabetes.      CBG: Recent Labs  Lab 08/20/24 1526 08/20/24 1913 08/21/24 0734 08/21/24 1105 08/21/24 1545  GLUCAP 155* 119* 74 146* 162*       Critical care time: 35 minutes      Lenny Drought, MD  [facility] Prefer epic messenger for cross cover needs    Critical care time was exclusive of separately billable procedures and treating other patients.  Critical care was necessary to treat or prevent imminent or life-threatening deterioration.  Critical care was time spent personally by me on the following activities: development of treatment plan with patient and/or surrogate as well as nursing, discussions with consultants, evaluation of patient's response to treatment, examination of patient, obtaining history from patient or surrogate, ordering and performing treatments and interventions, ordering and review of laboratory studies, ordering and review of radiographic studies, pulse oximetry, re-evaluation of patient's condition and participation in multidisciplinary rounds.    "

## 2024-08-25 NOTE — TOC Progression Note (Signed)
 Transition of Care [facility]) - Progression Note    Patient Details  Name: Benjamin Casey MRN: [id number] Date of Birth: [birth year]  Transition of Care [facility]) CM/SW Contact  Tom-Johnson, Larwence Tu Daphne, RN Phone Number: 08/25/2024, 1:00 PM  Clinical Narrative:     Patient weaning on O2, from 40L to 9L HFNC today. Continues Neb tx.   Patient not Medically ready for discharge, CM will arrange home O2 f needed at discharge.  CM will continue to follow as patient progresses with care towards discharge.                      Expected Discharge Plan and Services                                               Social Drivers of Health (SDOH) Interventions SDOH Screenings   Food Insecurity: Patient Unable To Answer (08/17/2024)  Housing: Patient Unable To Answer (08/17/2024)  Transportation Needs: Patient Unable To Answer (08/17/2024)  Utilities: Patient Unable To Answer (08/17/2024)  Depression (PHQ2-9): Low Risk (09/24/2023)  Stress: No Stress Concern Present (08/09/2024)   Received from [facility]  Tobacco Use: Medium Risk (08/11/2024)   Received from [facility]    Readmission Risk Interventions     No data to display

## 2024-08-26 DIAGNOSIS — J101 Influenza due to other identified influenza virus with other respiratory manifestations: Secondary | ICD-10-CM | POA: Diagnosis not present

## 2024-08-26 DIAGNOSIS — E669 Obesity, unspecified: Secondary | ICD-10-CM

## 2024-08-26 DIAGNOSIS — J9621 Acute and chronic respiratory failure with hypoxia: Secondary | ICD-10-CM | POA: Diagnosis not present

## 2024-08-26 DIAGNOSIS — J441 Chronic obstructive pulmonary disease with (acute) exacerbation: Secondary | ICD-10-CM | POA: Diagnosis not present

## 2024-08-26 NOTE — Progress Notes (Signed)
 " Progress Note   Patient: Benjamin Casey FMW:[id number] DOB: 04/08/1971 DOA: 08/17/2024     9 DOS: the patient was seen and examined on 08/26/2024   Brief hospital course: As per HPI - Benjamin Casey is a 53 year old male with history of COPD, HTN, former tobacco abuse who presented to ED by EMS with respiratory distress. On arrival EMS found patient's SpO2 to be 41% on room air. Per patient's aunt his roommate called her earlier today saying that he needed to go to the hospital. When she arrived at the house she found that he was blue in the face and lips, breathing heavily and murmuring incomprehensible sounds only. She does not know if he had any upper respiratory infections prior to becoming acutely ill. He is a former heavy smoker who quit about 15 years ago, former drug use and no alcohol use. He does not wear home oxygen .     He was recently admitted for [facility] from 12/13-12/18 for COPD exacerbation. At that time he was unable to get home O2 given lack of insurance. He was also prescribed steroid taper, duonebs and symbicort. He was referred to a pulmonologist but his aunt does not think he ever went. She also does not think he was compliant with his medications after discharge.  Patient intubated in ED, admitted to ICU for respiratory failure, COPD exacerbation, flu A+, intubated 12/18 in ED. He got rocephin  and doxy, tamiflu  along with duonebs, IV steroids. Patient got extubated 12/21. Steroid tapered down, off tamiflu , abx. He was placed on Bipap currently on 9L high flow O2 stable, transferred to TRH service 12/27.  Assessment and Plan: Acute on chronic respiratory failure with hypoxia and hypercarbia- S/p extubation 12/21. COPD exacerbation  Influenza A infection Patient will be continued on supplemental O2 to maintain sat>90%. I went down to 7L supplemental oxygen , saturating 94%. Continue to wean oxygen  as tolerated. Continue LABA/ LAMA/ ICS, as need duonebs. Finished Tamiflu ,  ceftriaxone  and Doxy Finished prednisone  taper. Use BiPAP at night. Use Lasix  PRN.  He will need home o2 eval prior to discharge.    Encephalopathy-improved  In the setting of hypercarbia and hypoxia. Mental status at baseline.   History of HTN BP lower side. Holding PTA lisinopril  given hyperkalemia    Polycythemia, likely due to chronic hypoxemia-improved  Obesity class I- BMI 33.38 Diet, exercise and weight reduction advised.     Out of bed to chair. Incentive spirometry. Nursing supportive care. Fall, aspiration precautions. Diet:  Diet Orders (From admission, onward)     Start     Ordered   08/20/24 1131  Diet regular Room service appropriate? Yes; Fluid consistency: Thin  Diet effective now       Question Answer Comment  Room service appropriate? Yes   Fluid consistency: Thin      08/20/24 1131           DVT prophylaxis: heparin  injection 5,000 Units Start: 08/17/24 1730 SCDs Start: 08/17/24 1717  Level of care: Progressive   Code Status: Full Code  Subjective: Patient is seen and examined today morning. He is on 9L oxygen  which I decreased to 7L saturating 94%. He denies chest pain, shortness of breath. Eating fair. Getting out of bed.  Physical Exam: Vitals:   08/26/24 0700 08/26/24 0724 08/26/24 0900 08/26/24 0930  BP: 104/70  119/87   Pulse: 76  95 92  Resp:   20 20  Temp:   97.9 F (36.6 C)   TempSrc:  Oral   SpO2: 91% 95% 98%   Weight:      Height:        General -  Middle aged Caucasian obese male, no apparent distress HEENT - PERRLA, EOMI, atraumatic head, non tender sinuses. Lung - Clear, basal rales,no rhonchi, wheezes. Heart - S1, S2 heard, no murmurs, rubs, trace pedal edema. Abdomen - Soft, non tender, obese, bowel sounds good Neuro - Alert, awake and oriented x 3, non focal exam. Skin - Warm and dry.  Data Reviewed:      Latest Ref Rng & Units 08/25/2024   12:02 PM 08/20/2024    2:06 AM 08/18/2024    8:40 AM  CBC   WBC 4.0 - 10.5 K/uL 8.3  8.4    Hemoglobin 13.0 - 17.0 g/dL 83.3  86.4  83.6   Hematocrit 39.0 - 52.0 % 51.6  43.4  48.0   Platelets 150 - 400 K/uL 311  201        Latest Ref Rng & Units 08/25/2024   12:02 PM 08/23/2024    2:02 AM 08/22/2024    2:12 AM  BMP  Glucose 70 - 99 mg/dL 98  99  95   BUN 6 - 20 mg/dL 17  24  20    Creatinine 0.61 - 1.24 mg/dL 9.14  9.14  9.21   Sodium 135 - 145 mmol/L 138  139  140   Potassium 3.5 - 5.1 mmol/L 4.3  4.0  4.2   Chloride 98 - 111 mmol/L 95  97  100   CO2 22 - 32 mmol/L 35  34  32   Calcium  8.9 - 10.3 mg/dL 9.1  9.2  9.1    DG CHEST PORT 1 VIEW Result Date: 08/25/2024 CLINICAL DATA:  Respiratory failure. EXAM: PORTABLE CHEST 1 VIEW COMPARISON:  08/24/2024 FINDINGS: Similar left greater than right basilar atelectasis or infiltrate. No pulmonary edema or substantial pleural effusion. Cardiopericardial silhouette is at upper limits of normal for size. No acute bony abnormality. IMPRESSION: Similar left greater than right basilar atelectasis or infiltrate. Electronically Signed   By: Camellia Candle M.D.   On: 08/25/2024 07:45    Family Communication: Discussed with patient, understand and agree. All questions answered.  Disposition: Status is: Inpatient Remains inpatient appropriate because: high flow oxygen , home o2 eval.  Planned Discharge Destination: Home     Time spent: 51 minutes  Author: Concepcion Riser, MD 08/26/2024 1:17 PM Secure chat 7am to 7pm For on call review [email or web address].    "

## 2024-08-26 NOTE — Plan of Care (Signed)

## 2024-08-26 NOTE — Evaluation (Signed)
 Occupational Therapy Evaluation and Discharge Patient Details Name: Benjamin Casey MRN: [id number] DOB: 12/03/1970 Today's Date: 08/26/2024   History of Present Illness   Pt is 53 yo presenting [facility]/18 with shortness of breathe. Pt with O2 sats in 40s on arrival.  Pt was intubated in the ED and extubated same day. Pt with COPD exacerbation, Flu A +. Previously at [facility] earlier in the month but unable to Get O2 due to insurance issues.  PMH: COPD, HTN.     Clinical Impressions At basline, pt is Independent with ADLS, IADLs, and functional mobility. Pt now presents with impaired cardiopulmonary status and decreased activity tolerance. Pt currently demonstrating ability to complete ADLs Mod I, bed mobility Mod I, and STS transfers Independent. Pt VSS on 4L continuous O2 throughout session.OT educated pt in benefit of energy conservation strategies, including use of shower chair, with pt verbalizing understanding and politely declining shower chair at this time. Pt also reports no further OT-related questions or concerns at this time. No further benefit from acute OT services at this time and pt declining post-acute OT services. OT is signing off. Please reconsult as needed.     If plan is discharge home, recommend the following:   Assistance with cooking/housework;Assist for transportation     Functional Status Assessment   Patient has had a recent decline in their functional status and demonstrates the ability to make significant improvements in function in a reasonable and predictable amount of time.     Equipment Recommendations   Other (comment) (OT recommends shower chair for energy conservation; however, pt declines shower chair at this time)     Recommendations for Other Services         Precautions/Restrictions   Precautions Precautions: Other (comment) Recall of Precautions/Restrictions: Intact Precaution/Restrictions Comments: O2  sats Restrictions Weight Bearing Restrictions Per Provider Order: No     Mobility Bed Mobility Overal bed mobility: Modified Independent                  Transfers Overall transfer level: Independent Equipment used: None                      Balance Overall balance assessment: Mild deficits observed, not formally tested                                         ADL either performed or assessed with clinical judgement   ADL Overall ADL's : Modified independent                                       General ADL Comments: Pt demonstrating ability to complete all ADLs Mod I from bed level/at EOB in simulated tasks with pt declining further addressing ADLs and mobility this session with no reason specified. OT educated pt in energy conservation strategies with pt verbalizing understanding of education and politely declining further education.     Vision Patient Visual Report: No change from baseline Vision Assessment?: No apparent visual deficits Additional Comments: Vision [facility] for tasks assessed     Perception         Praxis         Pertinent Vitals/Pain Pain Assessment Pain Assessment: No/denies pain     Extremity/Trunk Assessment Upper Extremity Assessment Upper Extremity Assessment:  Right hand dominant;Overall [facility] for tasks assessed   Lower Extremity Assessment Lower Extremity Assessment: Defer to PT evaluation       Communication Communication Communication: No apparent difficulties   Cognition Arousal: Alert Behavior During Therapy: WFL for tasks assessed/performed Cognition: No apparent impairments             OT - Cognition Comments: Pt AAOx4 and pleasant throughout session. Cognition WFL for tasks assessed; not formally evaluated                 Following commands: Intact       Cueing  General Comments   Cueing Techniques: Verbal cues  VSS on 4L continuous O2 through nasal cannula  throughout session   Exercises     Shoulder Instructions      Home Living Family/patient expects to be discharged to:: Private residence Living Arrangements: Other relatives (aunt but may go to live with friend) Available Help at Discharge: Family;Friend(s);Available PRN/intermittently Type of Home: House Home Access: Level entry     Home Layout: Two level;Able to live on main level with bedroom/bathroom Alternate Level Stairs-Number of Steps: flight Alternate Level Stairs-Rails: Right;Left Bathroom Shower/Tub: Walk-in shower         Home Equipment: None          Prior Functioning/Environment Prior Level of Function : Independent/Modified Independent                    OT Problem List:     OT Treatment/Interventions:        OT Goals(Current goals can be found in the care plan section)   Acute Rehab OT Goals Patient Stated Goal: to return home OT Goal Formulation: All assessment and education complete, DC therapy   OT Frequency:       Co-evaluation              AM-PAC OT 6 Clicks Daily Activity     Outcome Measure Help from another person eating meals?: None Help from another person taking care of personal grooming?: None Help from another person toileting, which includes using toliet, bedpan, or urinal?: None Help from another person bathing (including washing, rinsing, drying)?: None Help from another person to put on and taking off regular upper body clothing?: None Help from another person to put on and taking off regular lower body clothing?: None 6 Click Score: 24   End of Session Equipment Utilized During Treatment: Oxygen  Nurse Communication: Mobility status  Activity Tolerance: Patient tolerated treatment well Patient left: in bed;with call bell/phone within reach  OT Visit Diagnosis: Other (comment) (impaired cardiopulmonary status)                Time: 9465-9456 OT Time Calculation (min): 9 min Charges:  OT General  Charges $OT Visit: 1 Visit OT Evaluation $OT Eval Low Complexity: 1 Low  Margarie Rockey HERO., OTR/L, MA Acute Rehab [phone]   [name] 08/26/2024, 7:31 PM

## 2024-08-26 NOTE — Progress Notes (Signed)
 PT Cancellation Note  Patient Details Name: Benjamin Casey MRN: [id number] DOB: [birth year]   Cancelled Treatment:    Reason Eval/Treat Not Completed: PT screened, no needs identified, will sign off  Previously evaluated by Physical therapy while in the ICU on 12/24 with the following recommendations:  Pt will benefit from continued mobility while in the hospital by mobility team and nursing staff but due to pt is at baseline level of functioning does not have needs for acute physical therapy services at this time.  New PT order acknowledged. I spoke with patient in his room on 3E. He is down to 4L HFNC and states he continues to do well mobilizing without issues. Declines further PT intervention. We are available for any significant change in functional status- please re-consult if needed. Singing-off at this time. Thank you!  Leontine Roads, PT, DPT [facility]  Rehabilitation Services Physical Therapist Office: [phone] Website: [email or web address]    Leontine GORMAN Roads 08/26/2024, 4:08 PM

## 2024-08-27 DIAGNOSIS — J9621 Acute and chronic respiratory failure with hypoxia: Secondary | ICD-10-CM | POA: Diagnosis not present

## 2024-08-27 NOTE — TOC Progression Note (Signed)
 Transition of Care [facility]) - Progression Note    Patient Details  Name: Benjamin Casey MRN: [id number] Date of Birth: 04-07-1971  [facility] of [facility]) CM/SW Contact  Olam FORBES Ally, LCSW Phone Number: 08/27/2024, 3:44 PM  Clinical Narrative:      CSW was alerted by RN that pt had a question about a disability form however CSW was unable to reach pt by phone.          Expected Discharge Plan and Services                 Social Drivers of Health (SDOH) Interventions SDOH Screenings   Food Insecurity: Patient Unable To Answer (08/17/2024)  Housing: Patient Unable To Answer (08/17/2024)  Transportation Needs: Patient Unable To Answer (08/17/2024)  Utilities: Patient Unable To Answer (08/17/2024)  Depression (PHQ2-9): Low Risk (09/24/2023)  Stress: No Stress Concern Present (08/09/2024)   Received from [facility]  Tobacco Use: Medium Risk (08/11/2024)   Received from [facility]    Readmission Risk Interventions     No data to display

## 2024-08-27 NOTE — Plan of Care (Signed)
   Problem: Safety: Goal: Non-violent Restraint(s) Outcome: Progressing   Problem: Education: Goal: Knowledge of General Education information will improve Description: Including pain rating scale, medication(s)/side effects and non-pharmacologic comfort measures Outcome: Progressing   Problem: Health Behavior/Discharge Planning: Goal: Ability to manage health-related needs will improve Outcome: Progressing   Problem: Clinical Measurements: Goal: Ability to maintain clinical measurements within normal limits will improve Outcome: Progressing Goal: Will remain free from infection Outcome: Progressing Goal: Diagnostic test results will improve Outcome: Progressing Goal: Respiratory complications will improve Outcome: Progressing Goal: Cardiovascular complication will be avoided Outcome: Progressing

## 2024-08-27 NOTE — Hospital Course (Signed)
 Brief hospital course:  Benjamin Casey is a 53 year old male with history of COPD, HTN, former tobacco abuse presented to hospital with acute respiratory distress. On arrival, EMS found patient's SpO2 to be 41% on room air.  EMS found that the patient was blue in the face and lips, breathing heavily and murmuring incomprehensible sounds only.  Of note patient was recently admitted at [facility] from 12/13-12/18 for COPD exacerbation. At that time he was unable to get home O2 given lack of insurance. He was also prescribed steroid taper, duonebs and symbicort. He was referred to a pulmonologist but I had not gone to see pulmonary's and there was some concern regarding compliance with medications after discharge.  Patient was intubated in the ED, admitted to ICU for respiratory failure, COPD exacerbation, flu A+, intubated 12/18 in ED. patient received rocephin  and doxy, tamiflu  along with duonebs, IV steroids during hospitalization..  Patient was subsequently transferred to [facility] service 12/27.   Assessment and Plan:  Acute on chronic respiratory failure with hypoxia and hypercarbia- S/p extubation 12/21. COPD exacerbation  Influenza A infection Patient is on 3 L of oxygen  by nasal cannula.  Continue LABA/ LAMA/ ICS, as need duonebs.  Continue Tamiflu  Rocephin  and doxycycline .  On BiPAP at nighttime.  Uses Lasix  as needed.   Metabolic encephalopathy-improved  In the setting of hypercarbia and hypoxia.  Mental status at baseline.   Essential hypertension Lisinopril  on hold.   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  Continue diet and lifestyle modification.  Debility deconditioning.  Patient has been seen by physical therapy.  Will benefit from

## 2024-08-27 NOTE — Progress Notes (Signed)
 " Benjamin Casey FMW:[id number] DOB: September 28, 1970 DOA: 08/17/2024 PCP: Pcp, No   LOS: 10 days   Brief narrative:  Benjamin Casey is a 53 year old male with history of COPD, HTN, former tobacco abuse presented to hospital with acute respiratory distress. On arrival, EMS found patient's SpO2 to be 41% on room air.  EMS found that the patient was blue in the face and lips, breathing heavily and murmuring incomprehensible sounds only.  Of note patient was recently admitted at [facility] from 12/13-12/18 for COPD exacerbation. At that time he was unable to get home O2 given lack of insurance. He was also prescribed steroid taper, duonebs and symbicort. He was referred to a pulmonologist but I had not gone to see pulmonary's and there was some concern regarding compliance with medications after discharge.  Patient was intubated in the ED, admitted to ICU for respiratory failure, COPD exacerbation, flu A+, intubated 12/18 in ED. Patient received rocephin  and doxy, tamiflu  along with duonebs, IV steroids during hospitalization..  Patient was subsequently transferred to [facility] service 12/27.     Assessment/Plan: Principal Problem:   Acute on chronic hypoxic respiratory failure (HCC) Active Problems:   COPD exacerbation (HCC)   Influenza A  Acute on chronic respiratory failure with hypoxia and hypercarbia- S/p extubation 12/21. COPD exacerbation  Influenza A infection Patient is on 3 L of oxygen  by nasal cannula.  Continue LABA/ LAMA/ ICS, as need duonebs.  Continue Tamiflu , Rocephin  and doxycycline .  On BiPAP at nighttime but did not require BiPAP yesterday night..  Uses Lasix  as needed.  Will likely need oxygen  on discharge.   Metabolic encephalopathy-improved  In the setting of hypercarbia and hypoxia.  Mental status at baseline.   Essential hypertension Lisinopril  on hold.   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   Continue diet and lifestyle modification.  Debility deconditioning.  Patient has been seen by physical therapy.  Will benefit from cardiopulmonary rehab as outpatient.  DVT prophylaxis: heparin  injection 5,000 Units Start: 08/17/24 1730 SCDs Start: 08/17/24 1717   Disposition: Likely home tomorrow.  Status is: Inpatient Remains inpatient appropriate because: Pending clinical improvement,    Code Status:     Code Status: Full Code  Family Communication: None at bedside  Consultants: Critical care  Procedures: None  Anti-infectives:  None currently  Anti-infectives (From admission, onward)    Start     Dose/Rate Route Frequency Ordered Stop   08/20/24 2200  oseltamivir  (TAMIFLU ) capsule 75 mg        75 mg Oral 2 times daily 08/20/24 1436 08/22/24 2135   08/18/24 2200  oseltamivir  (TAMIFLU ) capsule 75 mg  Status:  Discontinued        75 mg Per Tube 2 times daily 08/18/24 0914 08/20/24 1436   08/18/24 1000  doxycycline  (VIBRAMYCIN ) 100 mg in sodium chloride  0.9 % 250 mL IVPB        100 mg 125 mL/hr over 120 Minutes Intravenous Every 12 hours 08/18/24 0903 08/22/24 0700   08/18/24 0945  vancomycin  (VANCOCIN ) IVPB 1000 mg/200 mL premix  Status:  Discontinued        1,000 mg 200 mL/hr over 60 Minutes Intravenous  Once 08/18/24 0852 08/18/24 0856   08/17/24 1730  oseltamivir  (TAMIFLU ) capsule 75 mg  Status:  Discontinued        75 mg Per Tube 2 times daily 08/17/24 1721 08/18/24 0914   08/17/24 1730  azithromycin  (ZITHROMAX ) 500 mg in sodium chloride  0.9 % 250 mL IVPB  Status:  Discontinued        500 mg 250 mL/hr over 60 Minutes Intravenous Every 24 hours 08/17/24 1729 08/18/24 0903   08/17/24 1730  cefTRIAXone  (ROCEPHIN ) 1 g in sodium chloride  0.9 % 100 mL IVPB        1 g 200 mL/hr over 30 Minutes Intravenous Every 24 hours 08/17/24 1729 08/21/24 1813       Subjective: Today, patient was seen and examined at bedside.  Complains of cough and shortness of breath.   Denies any chest pain, nausea, vomiting, fever or chills.  Objective: Vitals:   08/27/24 0827 08/27/24 1204  BP: 124/62 113/81  Pulse:  79  Resp: 20 20  Temp: 98.4 F (36.9 C) 98 F (36.7 C)  SpO2:  96%    Intake/Output Summary (Last 24 hours) at 08/27/2024 1440 Last data filed at 08/27/2024 1254 Gross per 24 hour  Intake 1440 ml  Output --  Net 1440 ml   Filed Weights   08/18/24 0500 08/19/24 0352 08/20/24 0500  Weight: 93.1 kg 90.5 kg 93.8 kg   Body mass index is 33.38 kg/m.   Physical Exam:  GENERAL: Patient is alert awake and oriented. Not in obvious distress.  Obese build.  Nasal cannula HENT: No scleral pallor or icterus. Pupils equally reactive to light. Oral mucosa is moist NECK: is supple, no gross swelling noted. CHEST: Clear to auscultation, diminished breath sounds present bilaterally CVS: S1 and S2 heard, no murmur. Regular rate and rhythm.  ABDOMEN: Soft, non-tender, bowel sounds are present. EXTREMITIES: No edema. CNS: Cranial nerves are intact. No focal motor deficits. SKIN: warm and dry without rashes.  Data Review: I have personally reviewed the following laboratory data and studies,  CBC: Recent Labs  Lab 08/25/24 1202  WBC 8.3  HGB 16.6  HCT 51.6  MCV 93.5  PLT 311   Basic Metabolic Panel: Recent Labs  Lab 08/21/24 0117 08/22/24 0212 08/23/24 0202 08/25/24 1202  NA 142 140 139 138  K 4.4 4.2 4.0 4.3  CL 96* 100 97* 95*  CO2 38* 32 34* 35*  GLUCOSE 110* 95 99 98  BUN 20 20 24* 17  CREATININE 0.71 0.78 0.85 0.85  CALCIUM  8.5* 9.1 9.2 9.1  MG  --  2.3  --   --    Liver Function Tests: No results for input(s): AST, ALT, ALKPHOS, BILITOT, PROT, ALBUMIN in the last 168 hours. No results for input(s): LIPASE, AMYLASE in the last 168 hours. No results for input(s): AMMONIA in the last 168 hours. Cardiac Enzymes: No results for input(s): CKTOTAL, CKMB, CKMBINDEX, TROPONINI in the last 168 hours. BNP (last  3 results) No results for input(s): BNP in the last 8760 hours.  ProBNP (last 3 results) Recent Labs    08/17/24 1415  PROBNP 2,777.0*    CBG: Recent Labs  Lab 08/20/24 1526 08/20/24 1913 08/21/24 0734 08/21/24 1105 08/21/24 1545  GLUCAP 155* 119* 74 146* 162*   Recent Results (from the past 240 hours)  Blood culture (routine x 2)     Status: None   Collection Time: 08/17/24  4:10 PM   Specimen: BLOOD  Result Value Ref Range Status   Specimen Description BLOOD LEFT ANTECUBITAL  Final   Special Requests   Final    BOTTLES DRAWN AEROBIC AND ANAEROBIC Blood Culture adequate volume   Culture   Final    NO GROWTH 5 DAYS  Performed at [facility], [address], [place], [place] [address]    Report Status 08/22/2024 FINAL  Final  MRSA Next Gen by PCR, Nasal     Status: Abnormal   Collection Time: 08/17/24  9:28 PM   Specimen: Nasal Mucosa; Nasal Swab  Result Value Ref Range Status   MRSA by PCR Next Gen DETECTED (A) NOT DETECTED Final    Comment: RESULTS CALLED TO, READ BACK BY AND VERIFIED WITH: RN S.PAULSON ON 08/18/24 AT 0237 BY NM (NOTE) The GeneXpert MRSA Assay (FDA approved for NASAL specimens only), is one component of a comprehensive MRSA colonization surveillance program. It is not intended to diagnose MRSA infection nor to guide or monitor treatment for MRSA infections. Test performance is not FDA approved in patients less than 60 years old. Performed at [facility], [address], [place], [place] [address]   Blood culture (routine x 2)     Status: None   Collection Time: 08/17/24  9:44 PM   Specimen: BLOOD LEFT HAND  Result Value Ref Range Status   Specimen Description BLOOD LEFT HAND  Final   Special Requests   Final    BOTTLES DRAWN AEROBIC AND ANAEROBIC Blood Culture adequate volume   Culture   Final    NO GROWTH 5 DAYS Performed at [facility], [address], [place], [place] [address]    Report Status 08/22/2024 FINAL   Final     Studies: No results found.    Vernal Alstrom, MD  [facility] 08/27/2024  If 7PM-7AM, please contact night-coverage   "

## 2024-08-28 DIAGNOSIS — J9621 Acute and chronic respiratory failure with hypoxia: Secondary | ICD-10-CM | POA: Diagnosis not present

## 2024-08-28 MED ORDER — LISINOPRIL 20 MG PO TABS: 40.0000 mg | ORAL_TABLET | Freq: Every day | ORAL | Status: DC

## 2024-08-28 NOTE — Progress Notes (Signed)
 SATURATION QUALIFICATIONS: (This note is used to comply with regulatory documentation for home oxygen )  Patient Saturations on Room Air at Rest = 92%  Patient Saturations on Room Air while Ambulating = 84%  Patient Saturations on 2 Liters of oxygen  while Ambulating = 96%  Please briefly explain why patient needs home oxygen :  Patient desaturated with ambulation

## 2024-08-28 NOTE — Discharge Summary (Signed)
 "  Physician Discharge Summary  VERMON GRAYS FMW:[id number] DOB: Aug 15, 1971 DOA: 08/17/2024  PCP: Pcp, No  Admit date: 08/17/2024 Discharge date: 08/28/2024  Admitted From: Home  Discharge disposition: Home   Recommendations for Outpatient Follow-Up:   Follow up with your primary care provider in one week.  Check CBC, BMP, magnesium  in the next visit Patient has been prescribed oxygen  on discharge.  Please reassess at the next visit  Discharge Diagnosis:   Principal Problem:   Acute on chronic hypoxic respiratory failure (HCC) Active Problems:   COPD exacerbation (HCC)   Influenza A   Discharge Condition: Improved.  Diet recommendation: Low sodium, heart healthy.    Wound care: None.  Code status: Full.   History of Present Illness:   Benjamin Casey is a 53 year old male with history of COPD, HTN, former tobacco abuse presented to hospital with acute respiratory distress. On arrival, EMS found patient's SpO2 to be 41% on room air.  EMS found that the patient was blue in the face and lips, breathing heavily and murmuring incomprehensible sounds only.  Of note patient was recently admitted at [facility] from 12/13-12/18 for COPD exacerbation. At that time he was unable to get home O2 given lack of insurance. He was also prescribed steroid taper, duonebs and symbicort. He was referred to a pulmonologist but I had not gone to see pulmonary's and there was some concern regarding compliance with medications after discharge.  Patient was intubated in the ED, admitted to ICU for respiratory failure, COPD exacerbation, flu A+, intubated 12/18 in ED. Patient received rocephin  and doxy, tamiflu  along with duonebs, IV steroids during hospitalization..  Patient was subsequently transferred to TRH service 12/27.    Hospital Course:   Following conditions were addressed during hospitalization as listed below,  Acute on chronic respiratory failure with hypoxia and hypercarbia- S/p  extubation 12/21. COPD exacerbation  Influenza A infection Patient received supplemental oxygen  LABA/ LAMA/ ICS, as need duonebs, Tamiflu , Rocephin  and doxycycline  during hospitalization.  Initially required BiPAP.  At this time patient has remained stable and is at his baseline.  He has however qualified for supplemental oxygen  on discharge.   Metabolic encephalopathy-improved  In the setting of hypercarbia and hypoxia.  Mental status at baseline.  Will be on supplemental oxygen  on discharge.   Essential hypertension Lisinopril  and hydralazine  at home but patient's blood pressure remained stable.  Will discharge the patient on half dose of lisinopril  and discontinue hydralazine  at discharge will need to assess blood pressure with PCP.   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  Continue diet and lifestyle modification.   Debility deconditioning.  Patient has been seen by physical therapy.  Will benefit from cardiopulmonary rehab as outpatient.  Disposition.  At this time, patient is stable for disposition home with outpatient PCP follow-up.  Medical Consultants:   Critical care  Procedures:    BiPAP placement Subjective:   Today, patient was seen and examined at bedside.  Patient feels okay today.  Denies any chest pain, nausea, vomiting, fever, chills or rigor.  He is at his baseline breathing situation.  Has required supplemental oxygen .  Discharge Exam:   Vitals:   08/28/24 0742 08/28/24 0743  BP:    Pulse:    Resp:    Temp:    SpO2: 94% 94%   Vitals:   08/28/24 0740 08/28/24 0741 08/28/24 0742 08/28/24 0743  BP: 127/81  Pulse: 99     Resp: 20     Temp: 98 F (36.7 C)     TempSrc: Oral     SpO2: 94% 94% 94% 94%  Weight:      Height:        General: Alert awake, not in obvious distress, on nasal cannula oxygen  HENT: pupils equally reacting to light,  No scleral pallor or icterus noted. Oral  mucosa is moist.  Chest: Diminished breath sounds bilaterally.  No wheezes noted. CVS: S1 &S2 heard. No murmur.  Regular rate and rhythm. Abdomen: Soft, nontender, nondistended.  Bowel sounds are heard.   Extremities: No cyanosis, clubbing or edema.  Peripheral pulses are palpable. Psych: Alert, awake and oriented, normal mood CNS:  No cranial nerve deficits.  Power equal in all extremities.   Skin: Warm and dry.  No rashes noted.  The results of significant diagnostics from this hospitalization (including imaging, microbiology, ancillary and laboratory) are listed below for reference.     Diagnostic Studies:   ECHOCARDIOGRAM LIMITED Result Date: 08/18/2024    ECHOCARDIOGRAM LIMITED REPORT   Patient Name:   Benjamin Casey Date of Exam: 08/18/2024 Medical Rec #:  [id number]     Height:       66.0 in Accession #:    [id number]    Weight:       205.2 lb Date of Birth:  09-19-1970    BSA:          2.022 m Patient Age:    53 years      BP:           96/69 mmHg Patient Gender: M             HR:           80 bpm. Exam Location:  Inpatient Procedure: Limited Echo, Cardiac Doppler, Color Doppler and Intracardiac            Opacification Agent (Both Spectral and Color Flow Doppler were            utilized during procedure). Indications:    Acute respiratory distress  History:        Patient has no prior history of Echocardiogram examinations.                 COPD; Risk Factors:Hypertension and Diabetes.  Sonographer:    Philomena Daring Referring Phys: REXENE LOISE BLUSH IMPRESSIONS  1. Left ventricular ejection fraction, by estimation, is 60 to 65%. The left ventricle has normal function. The left ventricle has no regional wall motion abnormalities. There is mild left ventricular hypertrophy. Left ventricular diastolic parameters are consistent with Grade I diastolic dysfunction (impaired relaxation).  2. Right ventricular systolic function is mildly reduced. The right ventricular size is normal. Tricuspid  regurgitation signal is inadequate for assessing PA pressure.  3. A small pericardial effusion is present.  4. The mitral valve is normal in structure. Trivial mitral valve regurgitation. No evidence of mitral stenosis.  5. The aortic valve was not well visualized. Aortic valve regurgitation is not visualized. No aortic stenosis is present. FINDINGS  Left Ventricle: Left ventricular ejection fraction, by estimation, is 60 to 65%. The left ventricle has normal function. The left ventricle has no regional wall motion abnormalities. Definity  contrast agent was given IV to delineate the left ventricular  endocardial borders. The left ventricular internal cavity size was small. There is mild left ventricular hypertrophy. Left ventricular diastolic parameters are consistent with Grade I diastolic dysfunction (impaired  relaxation). Right Ventricle: The right ventricular size is normal. Right vetricular wall thickness was not well visualized. Right ventricular systolic function is mildly reduced. Tricuspid regurgitation signal is inadequate for assessing PA pressure. Pericardium: A small pericardial effusion is present. Mitral Valve: The mitral valve is normal in structure. Trivial mitral valve regurgitation. No evidence of mitral valve stenosis. Tricuspid Valve: The tricuspid valve is normal in structure. Tricuspid valve regurgitation is trivial. Aortic Valve: The aortic valve was not well visualized. Aortic valve regurgitation is not visualized. No aortic stenosis is present. Pulmonic Valve: The pulmonic valve was not well visualized. Pulmonic valve regurgitation is not visualized. Aorta: The aortic root is normal in size and structure. Additional Comments: Spectral Doppler performed. Color Doppler performed.  LEFT VENTRICLE PLAX 2D LVIDd:         3.90 cm   Diastology LVIDs:         2.80 cm   LV e' medial:    5.87 cm/s LV PW:         1.10 cm   LV E/e' medial:  8.5 LV IVS:        1.30 cm   LV e' lateral:   8.59 cm/s LVOT  diam:     2.00 cm   LV E/e' lateral: 5.8 LV SV:         71 LV SV Index:   35 LVOT Area:     3.14 cm  IVC IVC diam: 2.30 cm LEFT ATRIUM         Index LA diam:    3.50 cm 1.73 cm/m  AORTIC VALVE LVOT Vmax:   127.00 cm/s LVOT Vmean:  82.300 cm/s LVOT VTI:    0.225 m  AORTA Ao Root diam: 2.80 cm MITRAL VALVE MV Area (PHT): 3.39 cm    SHUNTS MV Decel Time: 224 msec    Systemic VTI:  0.22 m MV E velocity: 49.80 cm/s  Systemic Diam: 2.00 cm MV A velocity: 70.20 cm/s MV E/A ratio:  0.71 Lonni Nanas MD Electronically signed by Lonni Nanas MD Signature Date/Time: 08/18/2024/10:07:32 AM    Final    DG Abdomen 1 View Result Date: 08/17/2024 CLINICAL DATA:  OG tube EXAM: ABDOMEN - 1 VIEW COMPARISON:  None Available. FINDINGS: Nonobstructive bowel gas pattern.Well-positioned esophagogastric tube terminating in the region of the stomach.No pneumoperitoneum. No organomegaly or radiopaque calculi. Cholecystectomy clips. No acute fracture or destructive lesion. Multilevel thoracolumbar osteophytosis. IMPRESSION: Well-positioned esophagogastric tube terminating in the region of the stomach. Electronically Signed   By: Rogelia Myers M.D.   On: 08/17/2024 16:18   DG Chest Portable 1 View Result Date: 08/17/2024 CLINICAL DATA:  post intubation EXAM: PORTABLE CHEST - 1 VIEW COMPARISON:  08/17/2024 2:14 p.m. FINDINGS: Interval placement of an endotracheal tube, which terminates in the mid trachea. Esophagogastric tube courses below the diaphragm with the distal tip not included in the field of view. Similar diffuse interstitial opacities throughout both lungs, possibly due to underlying emphysema. No focal airspace consolidation, pleural effusion, or pneumothorax. Mild cardiomegaly.No acute fracture or destructive lesion. IMPRESSION: 1. Interval placement of endotracheal tube, which terminates in the mid trachea. No pneumothorax. 2. Esophagogastric tube courses below the diaphragm with the distal tip not  included in the field of view. 3. Similar diffuse interstitial opacities throughout both lungs, possibly due to underlying emphysema. Alternatively, atypical/viral infection or interstitial edema could have this appearance in the correct clinical context. Electronically Signed   By: Rogelia Myers M.D.   On: 08/17/2024 16:18  DG Chest Portable 1 View Result Date: 08/17/2024 EXAM: 1 VIEW(S) XRAY OF THE CHEST 08/17/2024 02:16:00 PM COMPARISON: None available. CLINICAL HISTORY: SOB FINDINGS: LUNGS AND PLEURA: Interstitial thickening. Low lung volumes. No pleural effusion. No pneumothorax. HEART AND MEDIASTINUM: No acute abnormality of the cardiac and mediastinal silhouettes. BONES AND SOFT TISSUES: No acute osseous abnormality. IMPRESSION: 1. Interstitial thickening. 2. Low lung volumes. Electronically signed by: Norleen Boxer MD 08/17/2024 02:53 PM EST RP Workstation: [id number]     Labs:   Basic Metabolic Panel: Recent Labs  Lab 08/22/24 0212 08/23/24 0202 08/25/24 1202  NA 140 139 138  K 4.2 4.0 4.3  CL 100 97* 95*  CO2 32 34* 35*  GLUCOSE 95 99 98  BUN 20 24* 17  CREATININE 0.78 0.85 0.85  CALCIUM  9.1 9.2 9.1  MG 2.3  --   --    GFR Estimated Creatinine Clearance: 106.5 mL/min (by C-G formula based on SCr of 0.85 mg/dL). Liver Function Tests: No results for input(s): AST, ALT, ALKPHOS, BILITOT, PROT, ALBUMIN in the last 168 hours. No results for input(s): LIPASE, AMYLASE in the last 168 hours. No results for input(s): AMMONIA in the last 168 hours. Coagulation profile No results for input(s): INR, PROTIME in the last 168 hours.  CBC: Recent Labs  Lab 08/25/24 1202  WBC 8.3  HGB 16.6  HCT 51.6  MCV 93.5  PLT 311   Cardiac Enzymes: No results for input(s): CKTOTAL, CKMB, CKMBINDEX, TROPONINI in the last 168 hours. BNP: Invalid input(s): POCBNP CBG: Recent Labs  Lab 08/21/24 1545  GLUCAP 162*   D-Dimer No results for input(s):  DDIMER in the last 72 hours. Hgb A1c No results for input(s): HGBA1C in the last 72 hours. Lipid Profile No results for input(s): CHOL, HDL, LDLCALC, TRIG, CHOLHDL, LDLDIRECT in the last 72 hours. Thyroid  function studies No results for input(s): TSH, T4TOTAL, T3FREE, THYROIDAB in the last 72 hours.  Invalid input(s): FREET3 Anemia work up No results for input(s): VITAMINB12, FOLATE, FERRITIN, TIBC, IRON, RETICCTPCT in the last 72 hours. Microbiology No results found for this or any previous visit (from the past 240 hours).   Discharge Instructions:   Discharge Instructions     Call MD for:  temperature >100.4   Complete by: As directed    Diet general   Complete by: As directed    Discharge instructions   Complete by: As directed    Follow up with your primary care provider in one week. Seek medical attention for worsening symptoms. Continue oxygen  as prescribed. Do not overexert   Increase activity slowly   Complete by: As directed       Allergies as of 08/28/2024       Reactions   Seasonal Ic [octacosanol]         Medication List     STOP taking these medications    hydrALAZINE  25 MG tablet Commonly known as: APRESOLINE        TAKE these medications    albuterol  (2.5 MG/3ML) 0.083% nebulizer solution Commonly known as: PROVENTIL  Take 3 mLs (2.5 mg total) by nebulization every 6 (six) hours as needed for wheezing or shortness of breath.   albuterol  108 (90 Base) MCG/ACT inhaler Commonly known as: VENTOLIN  HFA INHALE 1 PUFF BY MOUTH AS NEEDED. APPOINTMENT NEEDED FOR ADDITIONAL REFILLS.   Breztri  Aerosphere 160-9-4.8 MCG/ACT Aero inhaler Generic drug: budesonide -glycopyrrolate-formoterol Inhale 2 puffs into the lungs 2 (two) times daily.   furosemide  20 MG tablet Commonly known as: LASIX  Take  1 tablet by mouth once daily   lisinopril  20 MG tablet Commonly known as: ZESTRIL  Take 2 tablets (40 mg total) by mouth  daily. What changed: medication strength               Durable Medical Equipment  (From admission, onward)           Start     Ordered   08/28/24 1028  For home use only DME oxygen   Once       Comments: POC when approved Conservation device  Question Answer Comment  Length of Need Lifetime   Mode or (Route) Nasal cannula   Liters per Minute 2   Frequency Continuous (stationary and portable oxygen  unit needed)   Oxygen  conserving device Yes   Oxygen  delivery system: Gas      08/28/24 1027            Follow-up Information     [facility]. Go on 09/01/2024.   Why: @11 :30am Contact information: [address] [place]  [id number] [phone]        [facility] (DME) Follow up.   Specialty: DME Services Why: Company supplying [employer] information: [address] [place]  [address] [phone]                 Time coordinating discharge: 39 minutes  Signed:  Anthonella Klausner  [facility] 08/28/2024, 2:10 PM          "

## 2024-08-28 NOTE — Plan of Care (Signed)
  Problem: Education: Goal: Knowledge of General Education information will improve Description: Including pain rating scale, medication(s)/side effects and non-pharmacologic comfort measures Outcome: Progressing   Problem: Health Behavior/Discharge Planning: Goal: Ability to manage health-related needs will improve Outcome: Progressing   Problem: Safety: Goal: Non-violent Restraint(s) Outcome: Completed/Met

## 2024-08-28 NOTE — Progress Notes (Addendum)
 DISCHARGE NOTE HOME Benjamin Casey to be discharged Home per MD order. Discussed prescriptions and follow up appointments with the patient. Prescriptions given to patient; medication list explained in detail. Patient verbalized understanding.  Skin clean, dry and intact without evidence of skin break down, no evidence of skin tears noted. IV catheter discontinued intact. Site without signs and symptoms of complications. Dressing and pressure applied. Pt denies pain at the site currently. No complaints noted.  Patient free of lines, drains, and wounds.   An After Visit Summary (AVS) was printed and given to the patient. Taken to the discharge lounge to wait for O2 Patient escorted via wheelchair, and discharged home via private auto.  Peyton SHAUNNA Pepper, RN

## 2024-08-28 NOTE — TOC Transition Note (Signed)
 Transition of Care [facility]) - Discharge Note   Patient Details  Name: Benjamin Casey MRN: [id number] Date of Birth: 1970/10/14  [facility] CM/SW Contact:  Andrez JULIANNA George, RN Phone Number: 08/28/2024, 10:16 AM   Clinical Narrative:     Pt is discharging home with self care.  Pt qualified for home oxygen . Oxygen  referral sent to [employer]. [facility] will deliver portable tank to room for transport and concentrator to the home. Pt requesting POC. CM has updated [employer] on need for POC for after d/c.  Pt has transportation home.  Final next level of care: Home/Self Care Barriers to Discharge: No Barriers Identified   Patient Goals and CMS Choice     Choice offered to / list presented to : Patient      Discharge Placement                       Discharge Plan and Services Additional resources added to the After Visit Summary for                  DME Arranged: Oxygen  DME Agency: [employer] Date DME Agency Contacted: 08/28/24   Representative spoke with at DME Agency: [place]            Social Drivers of Health (SDOH) Interventions SDOH Screenings   Food Insecurity: Patient Unable To Answer (08/17/2024)  Housing: Patient Unable To Answer (08/17/2024)  Transportation Needs: Patient Unable To Answer (08/17/2024)  Utilities: Patient Unable To Answer (08/17/2024)  Depression (PHQ2-9): Low Risk (09/24/2023)  Stress: No Stress Concern Present (08/09/2024)   Received from [facility]  Tobacco Use: Medium Risk (08/11/2024)   Received from [facility]     Readmission Risk Interventions     No data to display

## 2024-08-28 NOTE — Plan of Care (Signed)
" °  Problem: Education: Goal: Knowledge of General Education information will improve Description: Including pain rating scale, medication(s)/side effects and non-pharmacologic comfort measures Outcome: Progressing   Problem: Clinical Measurements: Goal: Will remain free from infection Outcome: Progressing   Problem: Clinical Measurements: Goal: Diagnostic test results will improve Outcome: Progressing   Problem: Clinical Measurements: Goal: Respiratory complications will improve Outcome: Progressing   Problem: Clinical Measurements: Goal: Cardiovascular complication will be avoided Outcome: Progressing   Problem: Pain Managment: Goal: General experience of comfort will improve and/or be controlled Outcome: Progressing   Problem: Safety: Goal: Ability to remain free from injury will improve Outcome: Progressing   "

## 2024-08-29 ENCOUNTER — Other Ambulatory Visit (HOSPITAL_COMMUNITY): Payer: Self-pay

## 2024-08-30 ENCOUNTER — Other Ambulatory Visit: Payer: Self-pay

## 2024-08-30 ENCOUNTER — Other Ambulatory Visit (HOSPITAL_COMMUNITY): Payer: Self-pay

## 2024-08-30 ENCOUNTER — Other Ambulatory Visit: Payer: Self-pay | Admitting: Nurse Practitioner

## 2024-08-30 MED ORDER — LISINOPRIL 10 MG PO TABS: 20.0000 mg | ORAL_TABLET | Freq: Every day | ORAL | 1 refills | Status: DC

## 2024-08-30 NOTE — Progress Notes (Signed)
 Received call from patient needing clarification about lisinopril  order.  Prior to admission he had been on 40 mg daily.  During the hospital stay his blood pressure readings were soft/suboptimal so both lisinopril  and hydralazine  were held.  At time of discharge discharging physician recommended taking half of previous dose of lisinopril .  This dose would be 20 mg.  Clarify this dose with patient.  He reported he did not have any further lisinopril  available so a new prescription was sent to the pharmacy.  He is to take 2 of the 10 mg tablets to equal a dose of 20 mg daily.  Prescription sent to [facility] [place].

## 2024-09-01 ENCOUNTER — Ambulatory Visit: Admitting: Family Medicine

## 2024-09-01 VITALS — BP 122/65 | HR 100 | Temp 97.7°F | Ht 66.0 in | Wt 209.0 lb

## 2024-09-01 DIAGNOSIS — Z6833 Body mass index (BMI) 33.0-33.9, adult: Secondary | ICD-10-CM

## 2024-09-01 DIAGNOSIS — E1159 Type 2 diabetes mellitus with other circulatory complications: Secondary | ICD-10-CM | POA: Diagnosis not present

## 2024-09-01 DIAGNOSIS — E66811 Obesity, class 1: Secondary | ICD-10-CM | POA: Insufficient documentation

## 2024-09-01 DIAGNOSIS — R911 Solitary pulmonary nodule: Secondary | ICD-10-CM

## 2024-09-01 DIAGNOSIS — Z136 Encounter for screening for cardiovascular disorders: Secondary | ICD-10-CM

## 2024-09-01 DIAGNOSIS — I152 Hypertension secondary to endocrine disorders: Secondary | ICD-10-CM | POA: Diagnosis not present

## 2024-09-01 DIAGNOSIS — J449 Chronic obstructive pulmonary disease, unspecified: Secondary | ICD-10-CM | POA: Diagnosis not present

## 2024-09-01 DIAGNOSIS — Z130 Encounter for screening for diseases of the blood and blood-forming organs and certain disorders involving the immune mechanism: Secondary | ICD-10-CM

## 2024-09-01 DIAGNOSIS — Z1211 Encounter for screening for malignant neoplasm of colon: Secondary | ICD-10-CM | POA: Insufficient documentation

## 2024-09-01 DIAGNOSIS — Z833 Family history of diabetes mellitus: Secondary | ICD-10-CM | POA: Insufficient documentation

## 2024-09-01 DIAGNOSIS — Z79899 Other long term (current) drug therapy: Secondary | ICD-10-CM | POA: Insufficient documentation

## 2024-09-01 LAB — LIPID PANEL

## 2024-09-01 MED ORDER — OZEMPIC (0.25 OR 0.5 MG/DOSE) 2 MG/3ML ~~LOC~~ SOPN: 0.2500 mg | PEN_INJECTOR | SUBCUTANEOUS | 0 refills | Status: AC

## 2024-09-01 MED ORDER — BREZTRI AEROSPHERE 160-9-4.8 MCG/ACT IN AERO: 2.0000 | INHALATION_SPRAY | Freq: Two times a day (BID) | RESPIRATORY_TRACT | 11 refills | Status: AC

## 2024-09-01 MED ORDER — ALBUTEROL SULFATE HFA 108 (90 BASE) MCG/ACT IN AERS: 2.0000 | INHALATION_SPRAY | Freq: Four times a day (QID) | RESPIRATORY_TRACT | 0 refills | Status: AC | PRN

## 2024-09-01 NOTE — Progress Notes (Signed)
 "  New Patient Office Visit  Patient ID: Benjamin Casey, Male   DOB: October 30, 1970 54 y.o. MRN: [id number]  Chief Complaint  Patient presents with   Hospitalization Follow-up    admitted on 12/18 and d/c on 12/29 for Acute chronic respiratory failure, copd, and flu A   Requesting Disability paperwork to be filled out   Subjective:     Benjamin Casey presents to establish care  HPI  Discussed the use of AI scribe software for clinical note transcription with the patient, who gave verbal consent to proceed.  History of Present Illness   Benjamin Casey is a 54 year old male with COPD who presents with recent hospitalizations for COPD exacerbations.  Dyspnea and copd exacerbations - Recent hospitalizations for COPD exacerbations: admitted to [facility] December 08/12/24-12/18 for COPD exacerbation , discharged, then readmitted to [facility] 08/17/24-08/28/24 for another exacerbation due to flu A.  - Patient has not been followed by pulm due to lack of insurance but he now has insurance.  - At the time of the second admission EMS found the patient to be blue with SPO2 40%, patient was intubated. - Patient discharged from hospital with 2L O2, breztri , lasix , and lisinopril .  - Patient states that he did not get the breztri  inhaler upon discharge.  - Someone came to the home and dispensed O2 tanks and concentrator.  - Does not have a pulmonologist.  - Uses albuterol  as a rescue inhaler - Recent labs show A1C of 6.7 but patient is not being treated for diabetes.  - CT scan revealed 2 mm pulm nodule - Long term smoker but has since quit.   Peripheral edema - Mild swelling in the legs at times - Uses 20 mg Lasix  as needed for shortness of breath or swelling  Glycemic control - Diabetes with recent A1c of 6.7% (previously 6.5%). Is not on any DM medications.  - Significantly reduced soda intake, currently consumes one or two sodas occasionally  Antihypertensive therapy - Takes 40 mg lisinopril  daily   - Lasix  20 mg daily      Outpatient Encounter Medications as of 09/01/2024  Medication Sig   albuterol  (VENTOLIN  HFA) 108 (90 Base) MCG/ACT inhaler Inhale 2 puffs into the lungs every 6 (six) hours as needed for wheezing or shortness of breath.   budesonide -glycopyrrolate-formoterol (BREZTRI  AEROSPHERE) 160-9-4.8 MCG/ACT AERO inhaler Inhale 2 puffs into the lungs 2 (two) times daily.   furosemide  (LASIX ) 20 MG tablet Take 1 tablet by mouth once daily   Semaglutide,0.25 or 0.5MG /DOS, (OZEMPIC, 0.25 OR 0.5 MG/DOSE,) 2 MG/3ML SOPN Inject 0.25 mg into the skin once a week for 4 doses.   [DISCONTINUED] albuterol  (PROVENTIL ) (2.5 MG/3ML) 0.083% nebulizer solution Take 3 mLs (2.5 mg total) by nebulization every 6 (six) hours as needed for wheezing or shortness of breath.   [DISCONTINUED] albuterol  (VENTOLIN  HFA) 108 (90 Base) MCG/ACT inhaler INHALE 1 PUFF BY MOUTH AS NEEDED. APPOINTMENT NEEDED FOR ADDITIONAL REFILLS.   [DISCONTINUED] Budeson-Glycopyrrol-Formoterol (BREZTRI  AEROSPHERE) 160-9-4.8 MCG/ACT AERO Inhale 2 puffs into the lungs 2 (two) times daily.   [DISCONTINUED] lisinopril  (ZESTRIL ) 10 MG tablet Take 2 tablets (20 mg total) by mouth daily.   No facility-administered encounter medications on file as of 09/01/2024.    Past Medical History:  Diagnosis Date   Anxiety    COPD (chronic obstructive pulmonary disease) (HCC)    Hypertension     No past surgical history on file.  Family History  Problem Relation Age of Onset  Diabetes Mother    Hypertension Mother    COPD Mother    COPD Father    Hypertension Brother     Social History   Socioeconomic History   Marital status: Single    Spouse name: Not on file   Number of children: Not on file   Years of education: Not on file   Highest education level: Not on file  Occupational History   Not on file  Tobacco Use   Smoking status: Former    Current packs/day: 0.00    Types: Cigarettes    Quit date: 01/2021    Years since  quitting: 3.5   Smokeless tobacco: Current    Types: Chew  Vaping Use   Vaping status: Never Used  Substance and Sexual Activity   Alcohol use: Not Currently   Drug use: Not Currently    Types: Marijuana   Sexual activity: Yes  Other Topics Concern   Not on file  Social History Narrative   Not on file   Social Drivers of Health   Tobacco Use: Medium Risk (08/11/2024)   Received from [facility]   Patient History    Smoking Tobacco Use: Former    Smokeless Tobacco Use: Never    Passive Exposure: Never  Physicist, Medical Strain: Not on file  Food Insecurity: Patient Unable To Answer (08/17/2024)   Epic    Worried About Programme Researcher, Broadcasting/film/video in the Last Year: Patient unable to answer    Ran Out of Food in the Last Year: Patient unable to answer  Transportation Needs: Patient Unable To Answer (08/17/2024)   Epic    Lack of Transportation (Medical): Patient unable to answer    Lack of Transportation (Non-Medical): Patient unable to answer  Physical Activity: Not on file  Stress: No Stress Concern Present (08/09/2024)   Received from [facility] of Occupational Health - Occupational Stress Questionnaire    Do you feel stress - tense, restless, nervous, or anxious, or unable to sleep at night because your mind is troubled all the time - these days?: Not at all  Social Connections: Not on file  Intimate Partner Violence: Patient Unable To Answer (08/17/2024)   Epic    Fear of Current or Ex-Partner: Patient unable to answer    Emotionally Abused: Patient unable to answer    Physically Abused: Patient unable to answer    Sexually Abused: Patient unable to answer  Depression (PHQ2-9): Low Risk (09/24/2023)   Depression (PHQ2-9)    PHQ-2 Score: 0  Alcohol Screen: Not on file  Housing: Patient Unable To Answer (08/17/2024)   Epic    Unable to Pay for Housing in the Last Year: Patient unable to answer    Number of Times Moved in the Last Year: Not on file     Homeless in the Last Year: Patient unable to answer  Utilities: Patient Unable To Answer (08/17/2024)   Epic    Threatened with loss of utilities: Patient unable to answer  Health Literacy: Not on file    ROS    Objective:    BP 122/65   Pulse 100   Temp 97.7 F (36.5 C)   Ht 5' 6 (1.676 m)   Wt 209 lb (94.8 kg)   SpO2 93% Comment: 2L 02 via [place]  BMI 33.73 kg/m   Physical Exam Vitals reviewed.  HENT:     Head: Normocephalic and atraumatic.  Eyes:     Extraocular Movements: Extraocular movements  intact.     Conjunctiva/sclera: Conjunctivae normal.     Pupils: Pupils are equal, round, and reactive to light.  Cardiovascular:     Rate and Rhythm: Normal rate and regular rhythm.     Pulses: Normal pulses.     Heart sounds: Normal heart sounds.  Pulmonary:     Effort: Pulmonary effort is normal. No respiratory distress.     Breath sounds: Normal breath sounds.     Comments: Patient initially walked in office without his oxygen  (reports leaving it in the truck) and Spo2 was 80% on RA. Patient visibly had increased WOB after walking on RA. Spo2 increased to 93% after being placed onto 2L O2 via [place] and WOB improved.  Musculoskeletal:        General: No swelling. Normal range of motion.  Skin:    General: Skin is warm and dry.  Neurological:     General: No focal deficit present.     Mental Status: He is alert and oriented to person, place, and time.  Psychiatric:        Mood and Affect: Mood normal.        Behavior: Behavior normal.          Assessment & Plan:   Problem List Items Addressed This Visit       Cardiovascular and Mediastinum   Hypertension associated with diabetes (HCC)   Relevant Medications   Semaglutide,0.25 or 0.5MG /DOS, (OZEMPIC, 0.25 OR 0.5 MG/DOSE,) 2 MG/3ML SOPN   Other Relevant Orders   Lipid Panel   TSH   T4, Free   Comprehensive metabolic panel with GFR   CBC with Differential   Other Visit Diagnoses       Chronic obstructive  pulmonary disease, unspecified COPD type (HCC)    -  Primary   Relevant Medications   budesonide -glycopyrrolate-formoterol (BREZTRI  AEROSPHERE) 160-9-4.8 MCG/ACT AERO inhaler   albuterol  (VENTOLIN  HFA) 108 (90 Base) MCG/ACT inhaler   Semaglutide,0.25 or 0.5MG /DOS, (OZEMPIC, 0.25 OR 0.5 MG/DOSE,) 2 MG/3ML SOPN   Other Relevant Orders   Ambulatory referral to Pulmonology     Pulmonary nodule       Relevant Orders   Ambulatory referral to Pulmonology     BMI 33.0-33.9,adult       Relevant Medications   Semaglutide,0.25 or 0.5MG /DOS, (OZEMPIC, 0.25 OR 0.5 MG/DOSE,) 2 MG/3ML SOPN     Screening for endocrine, nutritional, metabolic and immunity disorder       Relevant Orders   Lipid Panel   TSH   T4, Free   Comprehensive metabolic panel with GFR   CBC with Differential     Encounter for screening for cardiovascular disorders       Relevant Orders   Lipid Panel   TSH   T4, Free   Comprehensive metabolic panel with GFR   CBC with Differential       Assessment and Plan    Chronic obstructive pulmonary disease - Refilled Breztri  inhaler and provided sample today.  - Refilled albuterol  inhaler.  - Referred to pulmonologist urgently.  - CT scan shows 2mm pulm nodule. To be evaluated by pulm.  - Discussed importance of using 2L O2 consistently.  - Discussed S&S of respiratory failure that warrant seeking immediate medical attention.   Type 2 diabetes mellitus - A1c 6.7 2 weeks ago.  - Discussed GLP1 vs metformin benefits and potential side effects. Patient voiced interest in starting GLP1 if possible.  - Ordered ozempic starting dose. F/u in 2 weeks or sooner for  any concerns or side effects.  - Patient denied personal or family history of medullary thyroid  cancer or MEN 2.   General Health Maintenance - Discussed weight management with BMI 33 and the importance for COPD and diabetes.   - Plan to start ozempic.  - Labs ordered today, results pending.         Return in about  2 weeks (around 09/15/2024).   Oneil LELON Severin, FNP [facility]     "

## 2024-09-02 LAB — COMPREHENSIVE METABOLIC PANEL WITH GFR
ALT: 25 IU/L (ref 0–44)
AST: 20 IU/L (ref 0–40)
Albumin: 4.1 g/dL (ref 3.8–4.9)
Alkaline Phosphatase: 66 IU/L (ref 47–123)
BUN/Creatinine Ratio: 15 (ref 9–20)
BUN: 12 mg/dL (ref 6–24)
Bilirubin Total: 0.5 mg/dL (ref 0.0–1.2)
CO2: 31 mmol/L — AB (ref 20–29)
Calcium: 8.9 mg/dL (ref 8.7–10.2)
Chloride: 97 mmol/L (ref 96–106)
Creatinine, Ser: 0.82 mg/dL (ref 0.76–1.27)
Globulin, Total: 2.4 g/dL (ref 1.5–4.5)
Glucose: 84 mg/dL (ref 70–99)
Potassium: 4.6 mmol/L (ref 3.5–5.2)
Sodium: 139 mmol/L (ref 134–144)
Total Protein: 6.5 g/dL (ref 6.0–8.5)
eGFR: 104 mL/min/1.73

## 2024-09-02 LAB — LIPID PANEL
Cholesterol, Total: 176 mg/dL (ref 100–199)
HDL: 54 mg/dL
LDL CALC COMMENT:: 3.3 ratio (ref 0.0–5.0)
LDL Chol Calc (NIH): 109 mg/dL — AB (ref 0–99)
Triglycerides: 71 mg/dL (ref 0–149)
VLDL Cholesterol Cal: 13 mg/dL (ref 5–40)

## 2024-09-02 LAB — CBC WITH DIFFERENTIAL/PLATELET
Basophils Absolute: 0.1 x10E3/uL (ref 0.0–0.2)
Basos: 1 %
EOS (ABSOLUTE): 0.2 x10E3/uL (ref 0.0–0.4)
Eos: 3 %
Hematocrit: 47.6 % (ref 37.5–51.0)
Hemoglobin: 15.2 g/dL (ref 13.0–17.7)
Immature Grans (Abs): 0.1 x10E3/uL (ref 0.0–0.1)
Immature Granulocytes: 1 %
Lymphocytes Absolute: 2.1 x10E3/uL (ref 0.7–3.1)
Lymphs: 29 %
MCH: 30 pg (ref 26.6–33.0)
MCHC: 31.9 g/dL (ref 31.5–35.7)
MCV: 94 fL (ref 79–97)
Monocytes Absolute: 0.8 x10E3/uL (ref 0.1–0.9)
Monocytes: 11 %
Neutrophils Absolute: 4.1 x10E3/uL (ref 1.4–7.0)
Neutrophils: 55 %
Platelets: 201 x10E3/uL (ref 150–450)
RBC: 5.07 x10E6/uL (ref 4.14–5.80)
RDW: 13.5 % (ref 11.6–15.4)
WBC: 7.3 x10E3/uL (ref 3.4–10.8)

## 2024-09-02 LAB — TSH: TSH: 2.33 u[IU]/mL (ref 0.450–4.500)

## 2024-09-02 LAB — T4, FREE: Free T4: 0.95 ng/dL (ref 0.82–1.77)

## 2024-09-05 ENCOUNTER — Other Ambulatory Visit (HOSPITAL_COMMUNITY): Payer: Self-pay

## 2024-09-05 ENCOUNTER — Telehealth: Payer: Self-pay | Admitting: Pharmacy Technician

## 2024-09-05 ENCOUNTER — Ambulatory Visit: Payer: Self-pay | Admitting: Family Medicine

## 2024-09-05 NOTE — Telephone Encounter (Signed)
 Pt notified via MyChart. Ls

## 2024-09-05 NOTE — Telephone Encounter (Signed)
 Pharmacy Patient Advocate Encounter   Received notification from Onbase CMM KEY that prior authorization for Ozempic  (0.25 or 0.5 MG/DOSE) 2MG /3ML pen-injectors is required/requested.   Insurance verification completed.   The patient is insured through [facility] MEDICAID.   Per test claim: Patient MUST have a trial and failure of metformin before PA can be submitted for Ozempic 

## 2024-09-06 ENCOUNTER — Telehealth: Payer: Self-pay | Admitting: Family Medicine

## 2024-09-06 NOTE — Telephone Encounter (Unsigned)
 Copied from CRM #[id number]. Topic: Clinical - Refused Triage >> Sep 06, 2024 11:31 AM Alfonso ORN wrote: Patient/caller voiced complaints of Lauraine Rase (cousin) calling on patient behalf reason since patient got out of the hospital been   extreme fatigue no energy want to sleep all the time , only get up to go to restroom  Want to schedule an appt and establish care with Oneil Severin  Patient seen Oneil severin on 09/01/24 however patient has no pcp  . Declined transfer to triage.   ----------------------------------------------------------------------- From previous Reason for Contact - Red Word Triage: Red Word that prompted transfer to Nurse Triage:  ----------------------------------------------------------------------- From previous Reason for Contact - Scheduling: Patient/patient representative is calling to schedule an appointment. Refer to attachments for appointment information.

## 2024-09-07 ENCOUNTER — Other Ambulatory Visit: Payer: Self-pay | Admitting: Family Medicine

## 2024-09-07 ENCOUNTER — Telehealth: Payer: Self-pay

## 2024-09-07 ENCOUNTER — Other Ambulatory Visit (INDEPENDENT_AMBULATORY_CARE_PROVIDER_SITE_OTHER): Admitting: Family Medicine

## 2024-09-07 DIAGNOSIS — J441 Chronic obstructive pulmonary disease with (acute) exacerbation: Secondary | ICD-10-CM

## 2024-09-07 NOTE — Telephone Encounter (Signed)
 Order has been faxed to [facility] as requested. Received confirmation email that fax went through successfully.

## 2024-09-07 NOTE — Telephone Encounter (Signed)
 Copied from CRM #[id number]. Topic: General - Other >> Sep 07, 2024  1:52 PM Rosaria BRAVO wrote: Reason for CRM: Pt's cousin Lauraine Rase has the information for [employer] needed for his oxygen  machine  Fax: [phone] Send over the prescription put POC [id number] - for portable oxygen  concentrator   For appt tomorrow

## 2024-09-08 ENCOUNTER — Ambulatory Visit: Admitting: Family Medicine

## 2024-09-08 VITALS — BP 99/65 | HR 94 | Temp 98.1°F | Ht 66.0 in | Wt 209.0 lb

## 2024-09-08 DIAGNOSIS — J449 Chronic obstructive pulmonary disease, unspecified: Secondary | ICD-10-CM | POA: Diagnosis not present

## 2024-09-08 DIAGNOSIS — Z6833 Body mass index (BMI) 33.0-33.9, adult: Secondary | ICD-10-CM | POA: Diagnosis not present

## 2024-09-08 DIAGNOSIS — Z9981 Dependence on supplemental oxygen: Secondary | ICD-10-CM | POA: Diagnosis not present

## 2024-09-08 DIAGNOSIS — E1169 Type 2 diabetes mellitus with other specified complication: Secondary | ICD-10-CM | POA: Diagnosis not present

## 2024-09-08 DIAGNOSIS — Z7985 Long-term (current) use of injectable non-insulin antidiabetic drugs: Secondary | ICD-10-CM

## 2024-09-08 MED ORDER — ATORVASTATIN CALCIUM 10 MG PO TABS: 10.0000 mg | ORAL_TABLET | Freq: Every day | ORAL | 0 refills | Status: AC

## 2024-09-08 NOTE — Progress Notes (Signed)
 "  Established Patient Office Visit  Patient ID: Benjamin Casey, male    DOB: September 09, 1970  Age: 54 y.o. MRN: [id number] PCP: Alcus Oneil ORN, FNP  Chief Complaint  Patient presents with   Establish Care    Subjective:     HPI  Discussed the use of AI scribe software for clinical note transcription with the patient, who gave verbal consent to proceed.  History of Present Illness   Benjamin Casey is a 54 year old male with COPD presents to establish care and discuss lab work and medications.   Patient was seen in office on 09/01/2024 after being hospitalized last month for COPD exacerbation and flu that required intubation.  Patient states that he feels better today than he did at his last office visit with me.  Chronic obstructive pulmonary disease management - Patient states that he has successfully obtained his Breztri  inhaler and is using as prescribed - Has first pulmonology appointment scheduled for 09/20/2024 - Prescription for oxygen  concentrator was faxed yesterday  DM2 - Patient reports that he picked up his Ozempic  prescription from the pharmacy successfully. - We discussed recommendation for statin therapy for those with diabetes.  The patient is agreeable to starting a statin.  We discussed potential side effects.  History of tobacco use - History of smoking, discontinued three years ago following incident where patient felt like he could not breathe.           ROS    Objective:     BP 99/65 (Cuff Size: Large)   Pulse 94   Temp 98.1 F (36.7 C)   Ht 5' 6 (1.676 m)   Wt 209 lb (94.8 kg)   SpO2 97% Comment: with 2L of O2  BMI 33.73 kg/m    Physical Exam Vitals reviewed.  Constitutional:      Appearance: Normal appearance.  HENT:     Head: Normocephalic and atraumatic.  Eyes:     Extraocular Movements: Extraocular movements intact.     Conjunctiva/sclera: Conjunctivae normal.     Pupils: Pupils are equal, round, and reactive to light.   Cardiovascular:     Rate and Rhythm: Normal rate and regular rhythm.     Pulses: Normal pulses.     Heart sounds: Normal heart sounds. No murmur heard. Pulmonary:     Effort: Pulmonary effort is normal.     Breath sounds: Normal breath sounds.     Comments: Patient wearing 2L O2 via [place].  Musculoskeletal:        General: No deformity. Normal range of motion.     Cervical back: Normal range of motion.  Skin:    General: Skin is warm and dry.  Neurological:     General: No focal deficit present.     Mental Status: He is alert and oriented to person, place, and time.  Psychiatric:        Mood and Affect: Mood normal.        Behavior: Behavior normal.    Lab Results  Component Value Date   HGBA1C 6.7 (H) 08/17/2024   HGBA1C 6.5 (H) 09/24/2023      No results found for any visits on 09/08/24.    The 10-year ASCVD risk score (Arnett DK, et al., 2019) is: 5.8%    Assessment & Plan:   Problem List Items Addressed This Visit       Endocrine   Type 2 diabetes mellitus with other specified complication (HCC)   Relevant Medications  atorvastatin  (LIPITOR) 10 MG tablet   Other Visit Diagnoses       Chronic obstructive pulmonary disease, unspecified COPD type (HCC)    -  Primary     Requires oxygen  therapy         BMI 33.0-33.9,adult           Assessment and Plan    Chronic obstructive pulmonary disease - Patient states that he feels better today than at his last office visit with me. - VS stable today.  - Continue Breztri  inhaler. - Continue oxygen  therapy at 1lpm.  - Attend pulmonology appointment on 09/20/2024   Type 2 diabetes mellitus with other specified complication -Start Ozempic  0.25mg /week x 4 weeks. -Start Lipitor 10 mg/day       Return in about 4 weeks (around 10/06/2024).    Oneil LELON Severin, FNP [facility]   "

## 2024-09-15 ENCOUNTER — Telehealth: Payer: Self-pay | Admitting: Family Medicine

## 2024-09-15 NOTE — Telephone Encounter (Signed)
 Copied from CRM [id number]. Topic: Clinical - Order For Equipment >> Sep 15, 2024  3:18 PM Mercer PEDLAR wrote: Reason for CRM: Patient is requesting order for portable oxygen  concentrator to be re faxed to [facility] because they have not received the order.   [facility] [address] [place] [address] [phone] Fax: [phone]  Patient was unsure of the fax number, he was not sure if it is an 8 or a 6 in the last four digits of the number.

## 2024-09-15 NOTE — Addendum Note (Signed)
 Addended by: INA RAMP D on: 09/15/2024 04:52 PM   Modules accepted: Orders

## 2024-09-20 ENCOUNTER — Ambulatory Visit

## 2024-09-20 VITALS — BP 106/71 | HR 113 | Temp 97.6°F | Ht 66.0 in | Wt 204.6 lb

## 2024-09-20 DIAGNOSIS — Z87891 Personal history of nicotine dependence: Secondary | ICD-10-CM

## 2024-09-20 DIAGNOSIS — J9621 Acute and chronic respiratory failure with hypoxia: Secondary | ICD-10-CM

## 2024-09-20 DIAGNOSIS — Z23 Encounter for immunization: Secondary | ICD-10-CM

## 2024-09-20 DIAGNOSIS — J439 Emphysema, unspecified: Secondary | ICD-10-CM

## 2024-09-20 NOTE — Progress Notes (Deleted)
 "   Subjective:   PATIENT ID: Benjamin Casey GENDER: male DOB: 1970-11-29, MRN: [id number]   HPI Discussed the use of AI scribe software for clinical note transcription with the patient, who gave verbal consent to proceed.  History of Present Illness      Past Medical History:  Diagnosis Date   Anxiety    COPD (chronic obstructive pulmonary disease) (HCC)    Hypertension      Family History  Problem Relation Age of Onset   Diabetes Mother    Hypertension Mother    COPD Mother    COPD Father    Hypertension Brother      Social History   Socioeconomic History   Marital status: Single    Spouse name: Not on file   Number of children: Not on file   Years of education: Not on file   Highest education level: Not on file  Occupational History   Not on file  Tobacco Use   Smoking status: Former    Current packs/day: 0.00    Types: Cigarettes    Quit date: 01/2021    Years since quitting: 3.6   Smokeless tobacco: Current    Types: Chew   Tobacco comments:    Smoked for about 40 years. About 2 ppd. Quit 2022. 09/20/24  Vaping Use   Vaping status: Never Used  Substance and Sexual Activity   Alcohol use: Not Currently   Drug use: Not Currently    Types: Marijuana   Sexual activity: Yes  Other Topics Concern   Not on file  Social History Narrative   Not on file   Social Drivers of Health   Tobacco Use: High Risk (09/20/2024)   Patient History    Smoking Tobacco Use: Former    Smokeless Tobacco Use: Current    Passive Exposure: Not on [place] Strain: Not on file  Food Insecurity: Patient Unable To Answer (08/17/2024)   Epic    Worried About Programme Researcher, Broadcasting/film/video in the Last Year: Patient unable to answer    Ran Out of Food in the Last Year: Patient unable to answer  Transportation Needs: Patient Unable To Answer (08/17/2024)   Epic    Lack of Transportation (Medical): Patient unable to answer    Lack of Transportation (Non-Medical): Patient  unable to answer  Physical Activity: Not on file  Stress: No Stress Concern Present (08/09/2024)   Received from [facility] of Occupational Health - Occupational Stress Questionnaire    Do you feel stress - tense, restless, nervous, or anxious, or unable to sleep at night because your mind is troubled all the time - these days?: Not at all  Social Connections: Not on file  Intimate Partner Violence: Patient Unable To Answer (08/17/2024)   Epic    Fear of Current or Ex-Partner: Patient unable to answer    Emotionally Abused: Patient unable to answer    Physically Abused: Patient unable to answer    Sexually Abused: Patient unable to answer  Depression (PHQ2-9): Low Risk (09/08/2024)   Depression (PHQ2-9)    PHQ-2 Score: 0  Alcohol Screen: Not on file  Housing: Patient Unable To Answer (08/17/2024)   Epic    Unable to Pay for Housing in the Last Year: Patient unable to answer    Number of Times Moved in the Last Year: Not on file    Homeless in the Last Year: Patient unable to answer  Utilities: Patient Unable  To Answer (08/17/2024)   Epic    Threatened with loss of utilities: Patient unable to answer  Health Literacy: Not on file     Allergies[1]   Outpatient Medications Prior to Visit  Medication Sig Dispense Refill   albuterol  (VENTOLIN  HFA) 108 (90 Base) MCG/ACT inhaler Inhale 2 puffs into the lungs every 6 (six) hours as needed for wheezing or shortness of breath. 8 g 0   atorvastatin  (LIPITOR) 10 MG tablet Take 1 tablet (10 mg total) by mouth daily. 90 tablet 0   budesonide -glycopyrrolate-formoterol (BREZTRI  AEROSPHERE) 160-9-4.8 MCG/ACT AERO inhaler Inhale 2 puffs into the lungs 2 (two) times daily. 10.7 g 11   furosemide  (LASIX ) 20 MG tablet Take 1 tablet by mouth once daily 30 tablet 2   lisinopril  (ZESTRIL ) 10 MG tablet Take 20 mg by mouth daily.     Semaglutide ,0.25 or 0.5MG /DOS, (OZEMPIC , 0.25 OR 0.5 MG/DOSE,) 2 MG/3ML SOPN Inject 0.25 mg into the  skin once a week for 4 doses. 3 mL 0   No facility-administered medications prior to visit.    ROS Reviewed all systems and reported negative except as above     Objective:   Vitals:   09/20/24 0828  BP: 106/71  Pulse: 95  Temp: 97.6 F (36.4 C)  SpO2: 94%  Weight: 204 lb 9.6 oz (92.8 kg)  Height: 5' 6 (1.676 m)    Physical Exam Physical Exam      CBC    Component Value Date/Time   WBC 7.3 09/01/2024 1221   WBC 8.3 08/25/2024 1202   RBC 5.07 09/01/2024 1221   RBC 5.52 08/25/2024 1202   HGB 15.2 09/01/2024 1221   HCT 47.6 09/01/2024 1221   PLT 201 09/01/2024 1221   MCV 94 09/01/2024 1221   MCH 30.0 09/01/2024 1221   MCH 30.1 08/25/2024 1202   MCHC 31.9 09/01/2024 1221   MCHC 32.2 08/25/2024 1202   RDW 13.5 09/01/2024 1221   LYMPHSABS 2.1 09/01/2024 1221   MONOABS 1.3 (H) 08/17/2024 1415   EOSABS 0.2 09/01/2024 1221   BASOSABS 0.1 09/01/2024 1221     Results    PFT:     No data to display               Assessment & Plan:   Assessment and Plan Assessment & Plan         Zola Herter, MD [facility] Office: [phone]       [1]  Allergies Allergen Reactions   Seasonal Ic [Octacosanol]    "

## 2024-09-20 NOTE — Progress Notes (Signed)
 "   Subjective:   PATIENT ID: Benjamin Casey Ada GENDER: male DOB: 13-Dec-1970, MRN: [id number]   HPI Discussed the use of AI scribe software for clinical note transcription with the patient, who gave verbal consent to proceed.  History of Present Illness Benjamin Casey is a 54 year old male with COPD who presents for evaluation of a pulmonary nodule and COPD management. He was referred for evaluation of a pulmonary nodule seen on a CT scan.  He has a history of COPD and has been on oxygen  therapy since December 2025 following a hospitalization for a COPD exacerbation. Oxygen  is used at night and during physical activity, with settings of 2.5 liters per minute at rest and 4 liters per minute when active. Significant dyspnea impacts his ability to work, leading to retirement and pursuit of disability benefits.  He has a significant smoking history, having smoked two packs per day for approximately 40 years before quitting in 2022. He also has a history of marijuana and cocaine use. Smoking began at age 58 and continued until 2022.  He uses albuterol  and Breztri  inhalers, with two puffs of Breztri  in the morning and at night, and also uses a nebulizer with duo nebs twice daily. A pulse oximeter at home shows oxygen  saturation levels drop to 78-80% without supplemental oxygen .  In December 2025, he was hospitalized for a COPD exacerbation and tested positive for influenza A. He recalls a previous episode in 2022 where he was hospitalized and possibly received steroids.  No cardiac issues, recent fevers, or chills. He experiences a productive cough with yellow phlegm but no hemoptysis. No unintentional weight loss, though he is on Ozempic  for borderline diabetes with a recent HbA1c of 6.7%.  He is unsure why a recent CT scan was performed but it seems to be around the time he was will with influenza A. He has not had a flu shot this year and is unsure about receiving a pneumonia vaccine.     Past  Medical History:  Diagnosis Date   Anxiety    COPD (chronic obstructive pulmonary disease) (HCC)    Hypertension      Family History  Problem Relation Age of Onset   Diabetes Mother    Hypertension Mother    COPD Mother    COPD Father    Hypertension Brother      Social History   Socioeconomic History   Marital status: Single    Spouse name: Not on file   Number of children: Not on file   Years of education: Not on file   Highest education level: Not on file  Occupational History   Not on file  Tobacco Use   Smoking status: Former    Current packs/day: 0.00    Types: Cigarettes    Quit date: 01/2021    Years since quitting: 3.6   Smokeless tobacco: Current    Types: Chew   Tobacco comments:    Smoked for about 40 years. About 2 ppd. Quit 2022. 09/20/24  Vaping Use   Vaping status: Never Used  Substance and Sexual Activity   Alcohol use: Not Currently   Drug use: Not Currently    Types: Marijuana   Sexual activity: Yes  Other Topics Concern   Not on file  Social History Narrative   Not on file   Social Drivers of Health   Tobacco Use: High Risk (09/20/2024)   Patient History    Smoking Tobacco Use: Former    Smokeless  Tobacco Use: Current    Passive Exposure: Not on file  Financial Resource Strain: Not on file  Food Insecurity: Patient Unable To Answer (08/17/2024)   Epic    Worried About Programme Researcher, Broadcasting/film/video in the Last Year: Patient unable to answer    Ran Out of Food in the Last Year: Patient unable to answer  Transportation Needs: Patient Unable To Answer (08/17/2024)   Epic    Lack of Transportation (Medical): Patient unable to answer    Lack of Transportation (Non-Medical): Patient unable to answer  Physical Activity: Not on file  Stress: No Stress Concern Present (08/09/2024)   Received from [facility] - Occupational Stress Questionnaire    Do you feel stress - tense, restless, nervous, or anxious,  or unable to sleep at night because your mind is troubled all the time - these days?: Not at all  Social Connections: Not on file  Intimate Partner Violence: Patient Unable To Answer (08/17/2024)   Epic    Fear of Current or Ex-Partner: Patient unable to answer    Emotionally Abused: Patient unable to answer    Physically Abused: Patient unable to answer    Sexually Abused: Patient unable to answer  Depression (PHQ2-9): Low Risk (09/08/2024)   Depression (PHQ2-9)    PHQ-2 Score: 0  Alcohol Screen: Not on file  Housing: Patient Unable To Answer (08/17/2024)   Epic    Unable to Pay for Housing in the Last Year: Patient unable to answer    Number of Times Moved in the Last Year: Not on file    Homeless in the Last Year: Patient unable to answer  Utilities: Patient Unable To Answer (08/17/2024)   Epic    Threatened with loss of utilities: Patient unable to answer  Health Literacy: Not on file     Allergies[1]   Outpatient Medications Prior to Visit  Medication Sig Dispense Refill   albuterol  (VENTOLIN  HFA) 108 (90 Base) MCG/ACT inhaler Inhale 2 puffs into the lungs every 6 (six) hours as needed for wheezing or shortness of breath. 8 g 0   atorvastatin  (LIPITOR) 10 MG tablet Take 1 tablet (10 mg total) by mouth daily. 90 tablet 0   budesonide -glycopyrrolate-formoterol (BREZTRI  AEROSPHERE) 160-9-4.8 MCG/ACT AERO inhaler Inhale 2 puffs into the lungs 2 (two) times daily. 10.7 g 11   furosemide  (LASIX ) 20 MG tablet Take 1 tablet by mouth once daily 30 tablet 2   lisinopril  (ZESTRIL ) 10 MG tablet Take 20 mg by mouth daily.     Semaglutide ,0.25 or 0.5MG /DOS, (OZEMPIC , 0.25 OR 0.5 MG/DOSE,) 2 MG/3ML SOPN Inject 0.25 mg into the skin once a week for 4 doses. 3 mL 0   No facility-administered medications prior to visit.    ROS Reviewed all systems and reported negative except as above     Objective:   Vitals:   09/20/24 0828  BP: 106/71  Pulse: 95  Temp: 97.6 F (36.4 C)  SpO2: 94%   Weight: 204 lb 9.6 oz (92.8 kg)  Height: 5' 6 (1.676 m)    Physical Exam Physical Exam GENERAL: Appropriate to age, no acute distress. HEAD EYES EARS NOSE THROAT: Moist mucous membranes, atraumatic, normocephalic. CHEST: Clear to auscultation bilaterally, no wheezing, no crackles, no rales. CARDIAC: Regular rate and rhythm, normal S1, normal S2, no murmurs, no rubs, no gallops. ABDOMEN: Soft, nontender. NEUROLOGICAL: Motor and sensation grossly intact, alert and oriented times X 3. EXTREMITIES: Warm, well perfused, no edema.  CBC    Component Value Date/Time   WBC 7.3 09/01/2024 1221   WBC 8.3 08/25/2024 1202   RBC 5.07 09/01/2024 1221   RBC 5.52 08/25/2024 1202   HGB 15.2 09/01/2024 1221   HCT 47.6 09/01/2024 1221   PLT 201 09/01/2024 1221   MCV 94 09/01/2024 1221   MCH 30.0 09/01/2024 1221   MCH 30.1 08/25/2024 1202   MCHC 31.9 09/01/2024 1221   MCHC 32.2 08/25/2024 1202   RDW 13.5 09/01/2024 1221   LYMPHSABS 2.1 09/01/2024 1221   MONOABS 1.3 (H) 08/17/2024 1415   EOSABS 0.2 09/01/2024 1221   BASOSABS 0.1 09/01/2024 1221     Results Labs Influenza A (07/2024): Positive  Radiology Chest CT angiogram (07/2024): 2 mm left upper lobe pulmonary nodule (Independently interpreted)   PFT:     No data to display               Assessment & Plan:   Assessment and Plan Assessment & Plan Acute on chronic hypoxic respiratory failure Chronic hypoxic respiratory failure due to COPD, exacerbated by influenza A. Requires continuous oxygen  therapy. - Continue oxygen  therapy at 2.5 L at night and 4 L during the day.  -Patient walked in the clinic and did not desat below 90% on room air - Ordered pulmonary function tests. - Performed quick walk test to document oxygen  requirements. - Administered pneumonia vaccination.  Chronic obstructive pulmonary disease Long-standing COPD, exacerbated by smoking history. Managed with inhalers and nebulizer treatments.  Discussed new medication, deferred due to cost. - Continue Breztri  inhaler, 2 puffs in the morning and 2 puffs at night. - Continue albuterol  inhaler as needed. - Continue nebulizer treatments twice daily. - Consider Ohtuvayre  in the future - Consider pulmonary rehab in the future  Pulmonary nodule, left upper lobe 2 mm nodule in left upper lobe, requires monitoring due to smoking history. - Referred to lung cancer screening program for annual CT scan. - No further intervention needed on 2 mm pulmonary nodule    Zola Herter, MD [facility] Office: [phone]       [1]  Allergies Allergen Reactions   Seasonal Ic [Octacosanol]    "

## 2024-09-20 NOTE — Telephone Encounter (Signed)
 New order done and waiting on PCP's signature

## 2024-09-20 NOTE — Patient Instructions (Signed)
" °  VISIT SUMMARY: During your visit, we evaluated your pulmonary nodule and managed your COPD. We discussed your history of COPD, your current oxygen  therapy, and your smoking history. We also reviewed your inhaler and nebulizer use, and administered a pneumonia vaccination.  YOUR PLAN: -ACUTE ON CHRONIC HYPOXIC RESPIRATORY FAILURE: This condition means that your lungs are not getting enough oxygen , which is worsened by your COPD and a recent flu infection.  Only use your oxygen  if your oxygen  saturation drops below 88%. We have ordered pulmonary function tests to further evaluate your condition . You also received a pneumonia vaccination.  -CHRONIC OBSTRUCTIVE PULMONARY DISEASE (COPD): COPD is a chronic lung disease that makes it hard to breathe and is often caused by smoking. You should continue using your Breztri  inhaler with 2 puffs in the morning and 2 puffs at night, your albuterol  inhaler as needed, and your nebulizer treatments twice daily. We discussed a new medication, but decided to defer it due to cost.  -PULMONARY NODULE, LEFT UPPER LOBE: A pulmonary nodule is a small growth in the lung that needs to be monitored, especially given your smoking history. You have been referred to a lung cancer screening program for an annual CT scan to keep an eye on this nodule.  INSTRUCTIONS: Please follow up with the pulmonary function tests as ordered. Continue your current oxygen  therapy and inhaler/nebulizer regimen. Attend the lung cancer screening program for your annual CT scan. If you experience any worsening symptoms or have any concerns, please contact our office.    Contains text generated by [employer].   "

## 2024-09-20 NOTE — Addendum Note (Signed)
 Addended byBETHA JESSICA BOUCHARD S on: 09/20/2024 09:15 AM   Modules accepted: Orders

## 2024-09-27 ENCOUNTER — Telehealth: Payer: Self-pay

## 2024-09-27 ENCOUNTER — Telehealth: Payer: Self-pay | Admitting: Family Medicine

## 2024-09-27 DIAGNOSIS — R600 Localized edema: Secondary | ICD-10-CM

## 2024-09-27 MED ORDER — FUROSEMIDE 20 MG PO TABS: 20.0000 mg | ORAL_TABLET | Freq: Every day | ORAL | 2 refills | Status: AC

## 2024-09-27 NOTE — Telephone Encounter (Signed)
 Furosemide  last filled in 10/2023 by Marry. Pt states he had lost his job and insurance so he had not been taking his medications and recently started taking them again. Ok to refill?

## 2024-09-27 NOTE — Telephone Encounter (Signed)
 Lasix  sent in and pt aware of provider recommendations and voiced understanding.

## 2024-09-27 NOTE — Addendum Note (Signed)
 Addended by: RANDINE ARNULFO MATSU on: 09/27/2024 11:22 AM   Modules accepted: Orders

## 2024-09-27 NOTE — Telephone Encounter (Signed)
 Duplicate message

## 2024-09-27 NOTE — Telephone Encounter (Unsigned)
 Copied from CRM #[id number]. Topic: Clinical - Medication Refill >> Sep 27, 2024  9:12 AM Ivette P wrote: Medication:  furosemide  (LASIX ) 20 MG tablet   Has the patient contacted their pharmacy? Yes (Agent: If no, request that the patient contact the pharmacy for the refill. If patient does not wish to contact the pharmacy document the reason why and proceed with request.) (Agent: If yes, when and what did the pharmacy advise?)  This is the patient's preferred pharmacy:  [id number] - [place], [place] - [address] [place] [address] Phone: [phone] Fax: [phone]  Is this the correct pharmacy for this prescription? Yes If no, delete pharmacy and type the correct one.   Has the prescription been filled recently? No  Is the patient out of the medication? Yes, took last one 09/27/24  Has the patient been seen for an appointment in the last year OR does the patient have an upcoming appointment? Yes  Can we respond through MyChart? Yes  Agent: Please be advised that Rx refills may take up to 3 business days. We ask that you follow-up with your pharmacy.

## 2024-09-27 NOTE — Telephone Encounter (Signed)
 Copied from CRM #[id number]. Topic: Clinical - Medication Refill >> Sep 27, 2024  9:12 AM Ivette P wrote: Medication:  furosemide  (LASIX ) 20 MG tablet   Has the patient contacted their pharmacy? Yes (Agent: If no, request that the patient contact the pharmacy for the refill. If patient does not wish to contact the pharmacy document the reason why and proceed with request.) (Agent: If yes, when and what did the pharmacy advise?)  This is the patient's preferred pharmacy:  [id number] - [place], [place] - [address] [place] [address] Phone: [phone] Fax: [phone]  Is this the correct pharmacy for this prescription? Yes If no, delete pharmacy and type the correct one.   Has the prescription been filled recently? No  Is the patient out of the medication? Yes, took last one 09/27/24  Has the patient been seen for an appointment in the last year OR does the patient have an upcoming appointment? Yes  Can we respond through MyChart? Yes  Agent: Please be advised that Rx refills may take up to 3 business days. We ask that you follow-up with your pharmacy.

## 2024-09-28 NOTE — Telephone Encounter (Signed)
 LMOVM order faxed finally went through to [facility] on 09/27/24 to [phone]

## 2024-12-07 ENCOUNTER — Ambulatory Visit: Admitting: Family Medicine

## 2025-01-03 ENCOUNTER — Ambulatory Visit

## 2025-01-03 ENCOUNTER — Encounter
